# Patient Record
Sex: Female | Born: 1970 | Race: Black or African American | Hispanic: No | Smoking: Never smoker
Health system: Southern US, Community
[De-identification: ages and names within clinical notes are randomized; demographics above are authoritative.]

## PROBLEM LIST (undated history)

## (undated) DIAGNOSIS — G43909 Migraine, unspecified, not intractable, without status migrainosus: Secondary | ICD-10-CM

## (undated) DIAGNOSIS — E785 Hyperlipidemia, unspecified: Secondary | ICD-10-CM

## (undated) DIAGNOSIS — R87619 Unspecified abnormal cytological findings in specimens from cervix uteri: Secondary | ICD-10-CM

## (undated) DIAGNOSIS — E039 Hypothyroidism, unspecified: Secondary | ICD-10-CM

## (undated) DIAGNOSIS — E559 Vitamin D deficiency, unspecified: Secondary | ICD-10-CM

## (undated) DIAGNOSIS — L509 Urticaria, unspecified: Secondary | ICD-10-CM

## (undated) DIAGNOSIS — D649 Anemia, unspecified: Secondary | ICD-10-CM

## (undated) DIAGNOSIS — J45909 Unspecified asthma, uncomplicated: Secondary | ICD-10-CM

## (undated) HISTORY — DX: Vitamin D deficiency, unspecified: E55.9

## (undated) HISTORY — DX: Hyperlipidemia, unspecified: E78.5

## (undated) HISTORY — PX: TUBAL LIGATION: SHX77

## (undated) HISTORY — DX: Unspecified abnormal cytological findings in specimens from cervix uteri: R87.619

## (undated) HISTORY — DX: Anemia, unspecified: D64.9

## (undated) HISTORY — DX: Migraine, unspecified, not intractable, without status migrainosus: G43.909

## (undated) HISTORY — DX: Urticaria, unspecified: L50.9

## (undated) HISTORY — DX: Hypothyroidism, unspecified: E03.9

## (undated) HISTORY — DX: Unspecified asthma, uncomplicated: J45.909

---

## 2016-03-28 DIAGNOSIS — E039 Hypothyroidism, unspecified: Secondary | ICD-10-CM | POA: Insufficient documentation

## 2017-02-16 DIAGNOSIS — G43911 Migraine, unspecified, intractable, with status migrainosus: Secondary | ICD-10-CM | POA: Insufficient documentation

## 2017-05-20 DIAGNOSIS — E6609 Other obesity due to excess calories: Secondary | ICD-10-CM | POA: Insufficient documentation

## 2017-05-20 DIAGNOSIS — D649 Anemia, unspecified: Secondary | ICD-10-CM | POA: Insufficient documentation

## 2018-11-05 DIAGNOSIS — J45909 Unspecified asthma, uncomplicated: Secondary | ICD-10-CM | POA: Insufficient documentation

## 2018-12-25 DIAGNOSIS — R12 Heartburn: Secondary | ICD-10-CM | POA: Insufficient documentation

## 2019-05-23 ENCOUNTER — Ambulatory Visit (HOSPITAL_BASED_OUTPATIENT_CLINIC_OR_DEPARTMENT_OTHER)
Admission: RE | Admit: 2019-05-23 | Discharge: 2019-05-23 | Disposition: A | Discharge status: Home / Self Care | Payer: Managed Care, Other (non HMO) | Source: Ambulatory Visit | Attending: Family Medicine | Admitting: Family Medicine

## 2019-05-23 ENCOUNTER — Other Ambulatory Visit: Payer: Self-pay

## 2019-05-23 ENCOUNTER — Ambulatory Visit: Payer: Managed Care, Other (non HMO) | Admitting: Family Medicine

## 2019-05-23 ENCOUNTER — Encounter: Payer: Self-pay | Admitting: Family Medicine

## 2019-05-23 VITALS — BP 112/80 | HR 84 | Ht 64.5 in | Wt 227.2 lb

## 2019-05-23 DIAGNOSIS — M4310 Spondylolisthesis, site unspecified: Secondary | ICD-10-CM | POA: Diagnosis not present

## 2019-05-23 DIAGNOSIS — M4306 Spondylolysis, lumbar region: Secondary | ICD-10-CM

## 2019-05-23 DIAGNOSIS — M5416 Radiculopathy, lumbar region: Secondary | ICD-10-CM | POA: Insufficient documentation

## 2019-05-23 DIAGNOSIS — M4316 Spondylolisthesis, lumbar region: Secondary | ICD-10-CM | POA: Diagnosis not present

## 2019-05-23 IMAGING — DX DG LUMBAR SPINE COMPLETE 4+V
5 series · 5 of 5 positions shown · non-contrast
Comparison: None.

CLINICAL DATA: Left lower back pain with sciatica 1 month. No
injury.

EXAM:
LUMBAR SPINE - COMPLETE 4+ VIEW

[l-spine ap]
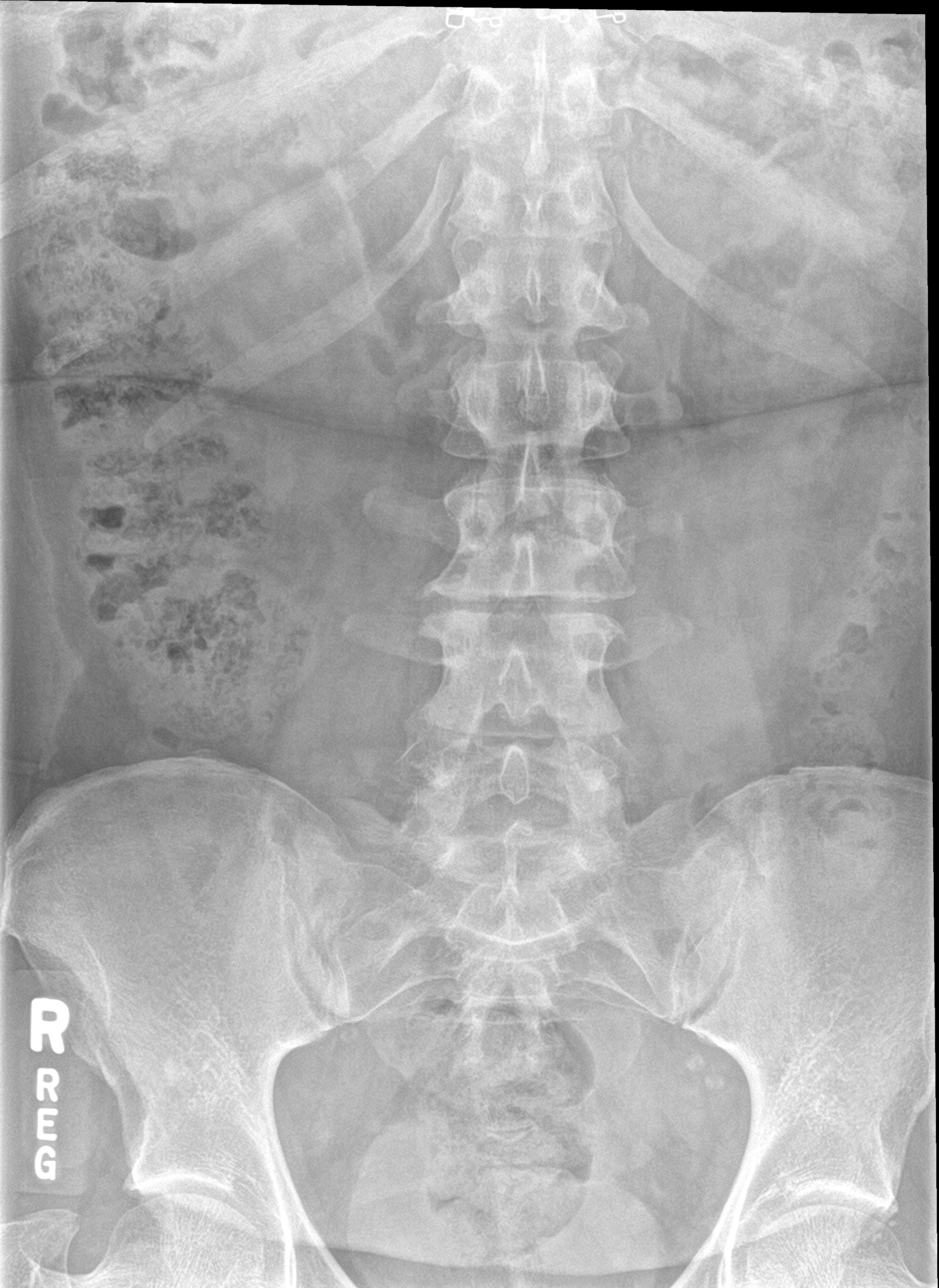

[l-spine obl (1 of 2)]
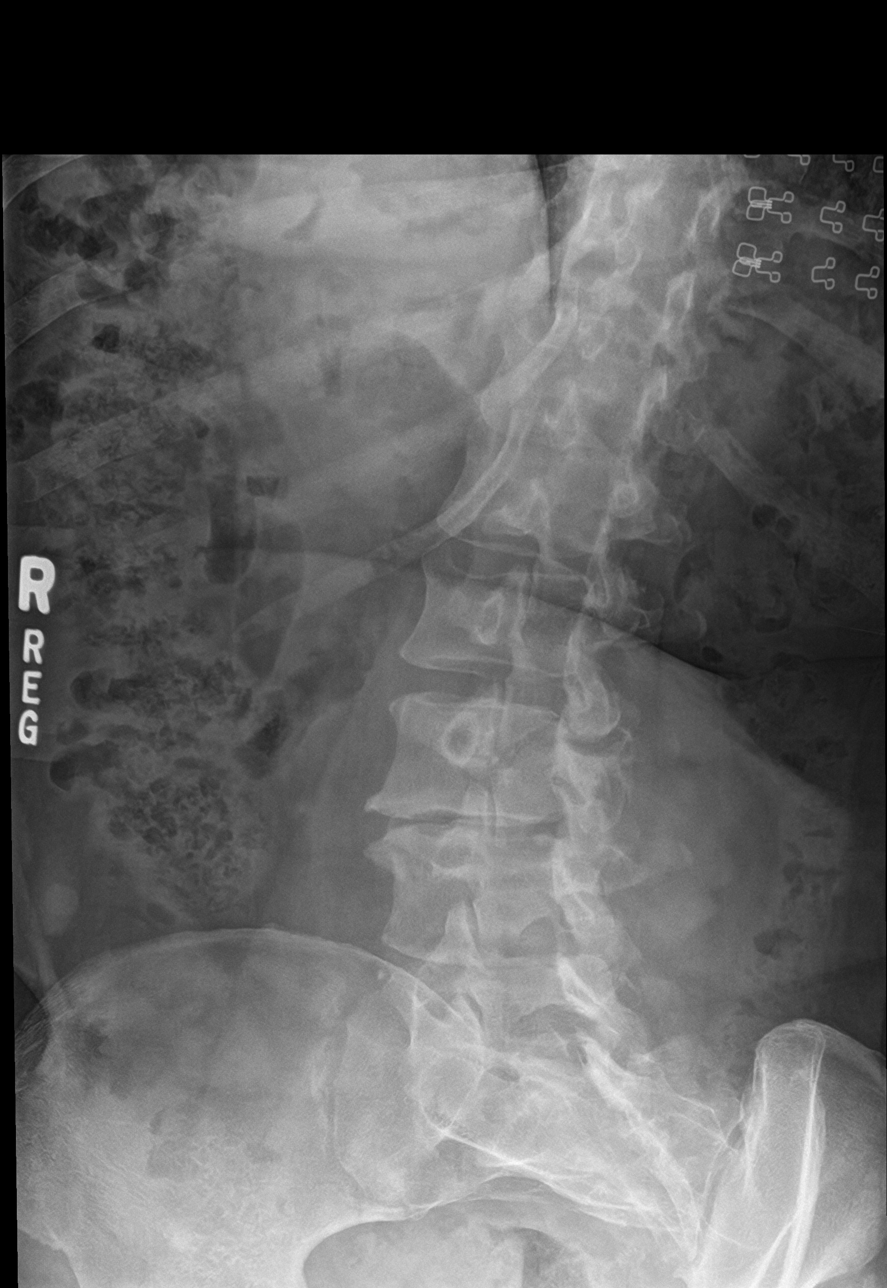

[l-spine obl (2 of 2)]
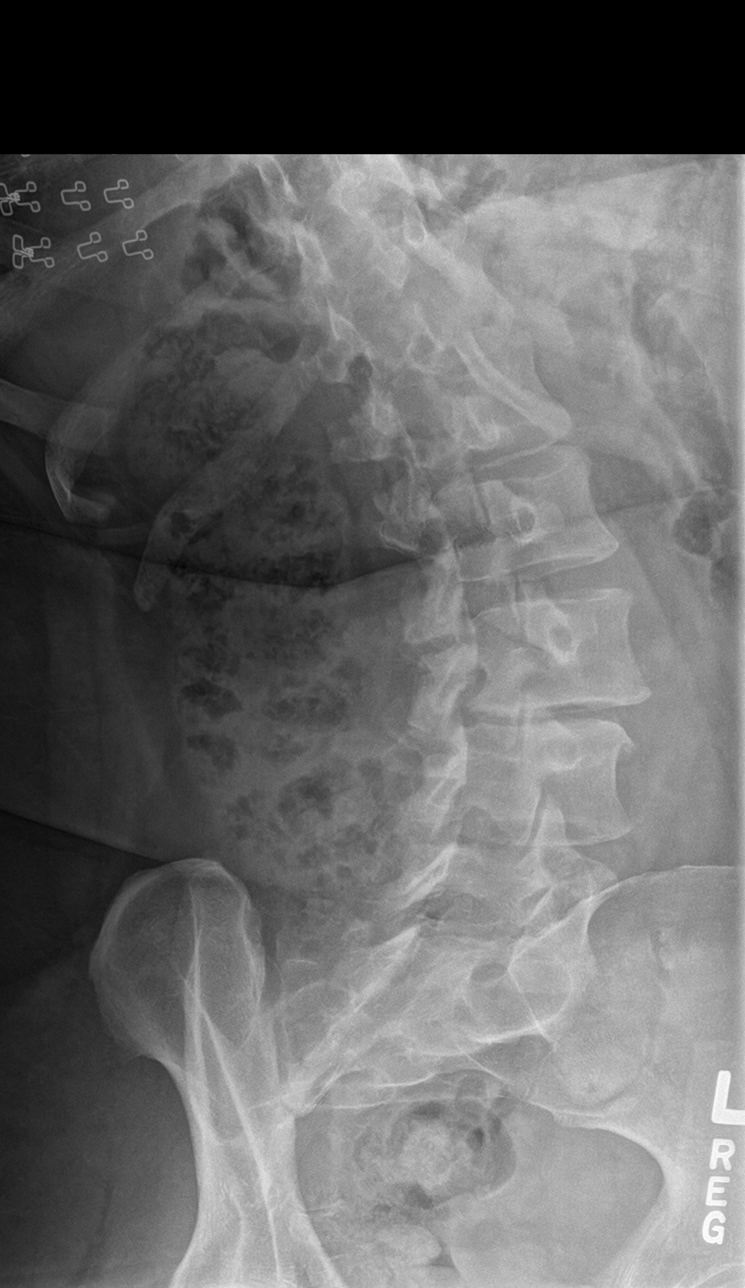

[l-spine lat]
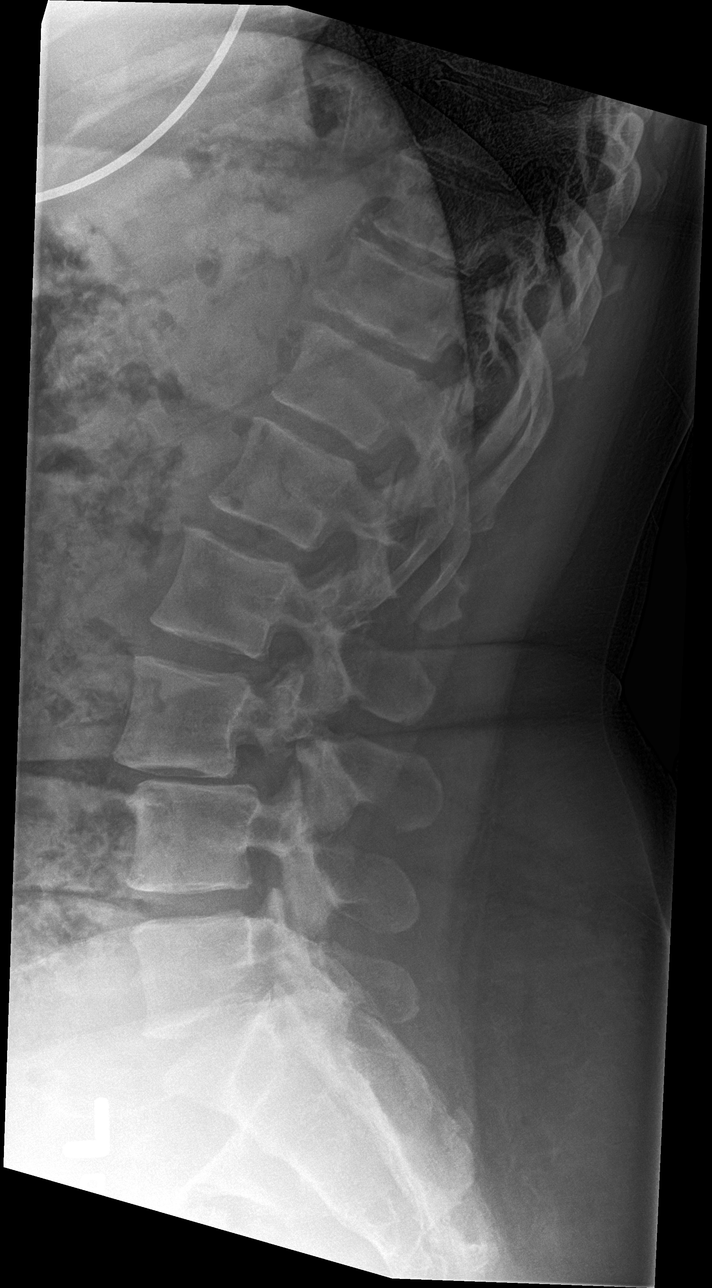

[l-spine spot]
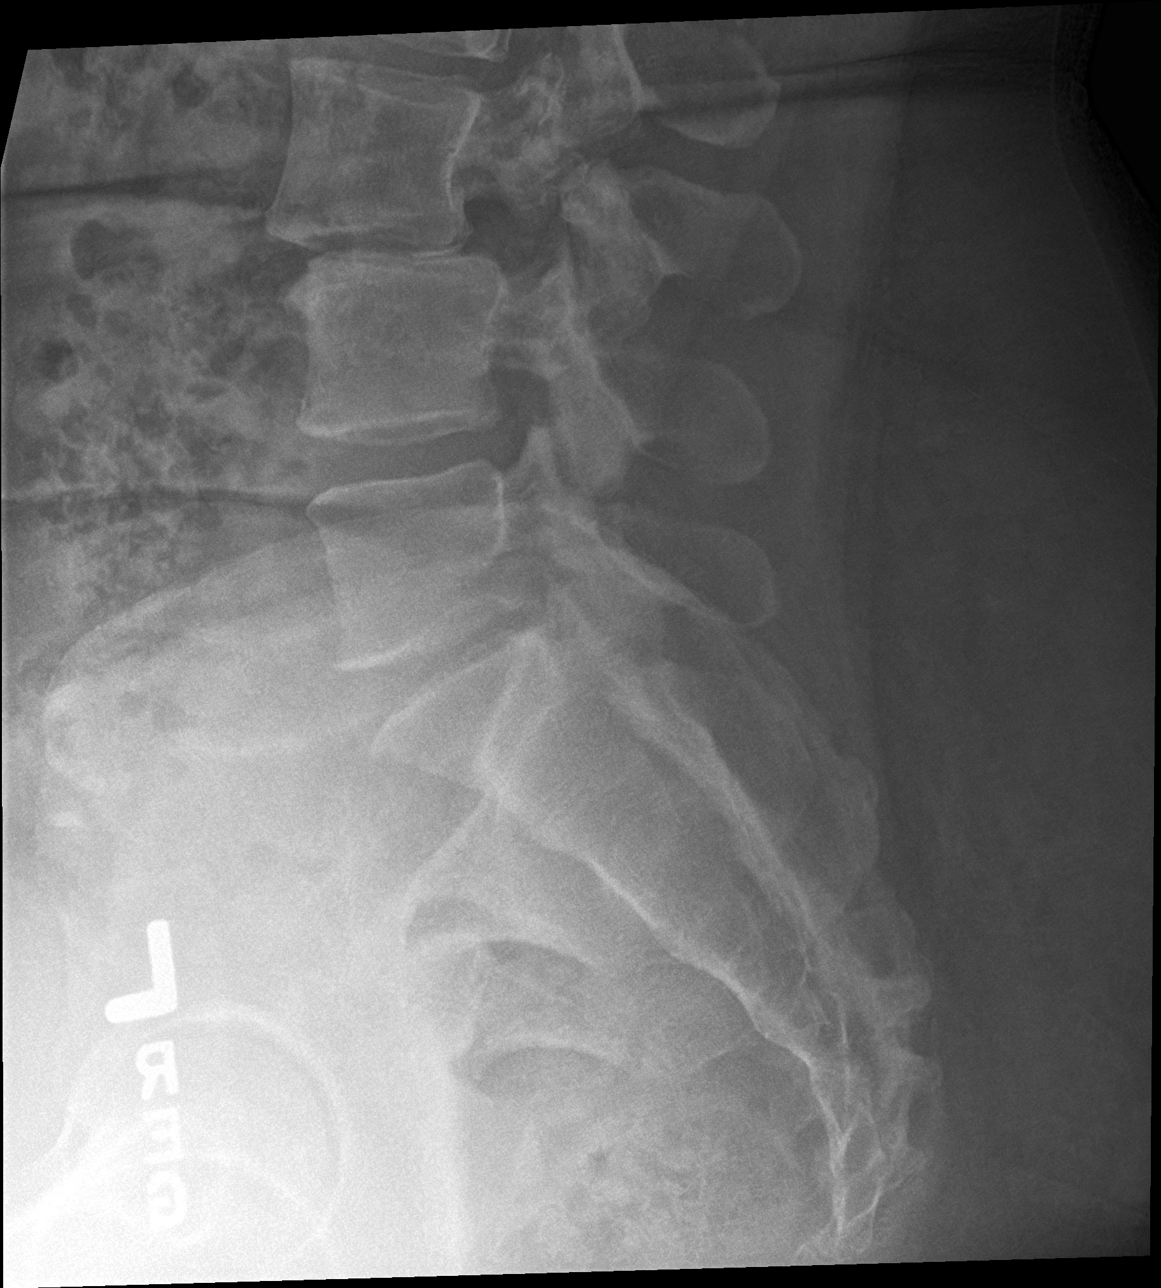

[5 of 5 positions shown; findings below may reference images not displayed]

FINDINGS: There is grade 1 anterolisthesis of L3 on L4 with bilateral L3
spondylolysis. Resultant moderate disc space narrowing at the L3-4
level. Minimal spondylosis of the lumbar spine. Facet arthropathy is
present over the lower lumbar spine.
IMPRESSION: Mild spondylosis of the lumbar spine with disc disease at the L3-4
level. There is grade 1 anterolisthesis of L3 on L4 due to bilateral
L3 spondylolysis.

## 2019-05-23 NOTE — Progress Notes (Addendum)
Subjective:    CC: Low back pain  I, Molly Weber, LAT, ATC, am serving as scribe for Dr. [name].  HPI: Pt is a 48 y/o female presenting w/ c/o low back pain x years.  Pt states that she did pretty well after the "shots" she received which she feels was in 2015.  Pt states her low back pain flared up about a month ago when she was running on the treadmill.  Her pain is located on the L side of her lower back w/ radiating pain into her L post thigh, stopping at the knee.  Pt denies any N/T into the L leg.  Aggravating factors include lifting, running, prolonged sitting, full L knee extension in tall sitting.  Pt has tried Flexeril, Naproxen, Advil, Aleve, lidocaine patches, heat and stretches.  The lidocaine patches did provide some relief.  Pain has been ongoing now for 4 weeks.  She saw her doctor in [place] about 4 weeks ago and started a series of physician directed conservative management exercises including stretching rest Flexeril and gabapentin.  She notes the lidocaine patches have helped more than anything else.  Past medical history, Surgical history, Family history not pertinant except as noted below, Social history, Allergies, and medications have been entered into the medical record, reviewed, and no changes needed.   Review of Systems: No headache, visual changes, nausea, vomiting, diarrhea, constipation, dizziness, abdominal pain, skin rash, fevers, chills, night sweats, weight loss, swollen lymph nodes, body aches, joint swelling, muscle aches, chest pain, shortness of breath, mood changes, visual or auditory hallucinations.   Objective:    Vitals:   05/23/19 0816  BP: 112/80  Pulse: 84  SpO2: 96%   General: Well Developed, well nourished, and in no acute distress.  Neuro/Psych: Alert and oriented x3, extra-ocular muscles intact, able to move all 4 extremities, sensation grossly intact. Skin: Warm and dry, no rashes noted.  Respiratory: Not using accessory  muscles, speaking in full sentences, trachea midline.  Cardiovascular: Pulses palpable, no extremity edema. Abdomen: Does not appear distended. MSK:  L-spine: Nontender to midline.  Tender to palpation left lumbar paraspinal musculature. Range of motion: Normal rotation and lateral flexion bilaterally.  Decreased extension and flexion range of motion. Lower extremity strength reflexes and sensation are equal and normal throughout bilateral extremities. Positive left-sided slump test. Normal gait.  Lab and Radiology Results X-ray images L-spine ordered and will be done later today or tomorrow. Addendum: Dg Lumbar Spine Complete  Result Date: 05/23/2019 CLINICAL DATA:  Left lower back pain with sciatica 1 month. No injury. EXAM: LUMBAR SPINE - COMPLETE 4+ VIEW COMPARISON:  None. FINDINGS: There is grade 1 anterolisthesis of L3 on L4 with bilateral L3 spondylolysis. Resultant moderate disc space narrowing at the L3-4 level. Minimal spondylosis of the lumbar spine. Facet arthropathy is present over the lower lumbar spine. 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 There is grade 1 anterolisthesis of L3 on L4 due to bilateral L3 spondylolysis. Electronically Signed   By: Elberta Fortis M.D.   On: 05/23/2019 15:02   I personally (independently) visualized and performed the interpretation of the images attached in this note.    Impression and Recommendations:    Assessment and Plan: 48 y.o. female with  Low back pain with lumbar or sacral radiculopathy likely S1 dermatome to the left side.  Plan for x-ray as above and referral to physical therapy.  If not improving in a few  weeks neck step would be MRI for injection planning.  Patient will also get detailed medical records of her last series of injections so we can know what worked and what did not work for her previously.  She will see me a MyChart message in about 2 weeks for an update.  Addendum: X-ray  shows unexpected anterolisthesis and spondylolysis at L3-4.  This could potentially cause some of her radicular symptoms.  Plan to proceed with MRI now.  This is for epidural injection and surgical planning.  PDMP reviewed during this encounter. Orders Placed This Encounter  Procedures  . DG Lumbar Spine Complete    Standing Status:   Future    Standing Expiration Date:   07/22/2020    Order Specific Question:   Reason for Exam (SYMPTOM  OR DIAGNOSIS REQUIRED)    Answer:   eval LBP    Order Specific Question:   Is patient pregnant?    Answer:   No    Order Specific Question:   Preferred imaging location?    Answer:   [facility] Specific Question:   Radiology Contrast Protocol - do NOT remove file path    Answer:   [email or web address]  . Ambulatory referral to Physical Therapy    Referral Priority:   Routine    Referral Type:   Physical Medicine    Referral Reason:   Specialty Services Required    Requested Specialty:   Physical Therapy   No orders of the defined types were placed in this encounter.   Discussed warning signs or symptoms. Please see discharge instructions. Patient expresses understanding.   The above documentation has been reviewed and is accurate and complete Lynne Leader

## 2019-05-23 NOTE — Patient Instructions (Addendum)
Thank you for coming in today.  We will proceed with xray and likely MRI.  OK to continue lidocaine patches, and gabapentin.   Please try to get detailed records from your last injection that worked. We will plan on doing the exact same thing.   You can attach records or documents to [place] message or you can have them faxed to me.   Come back or go to the emergency room if you notice new weakness new numbness problems walking or bowel or bladder problems.

## 2019-05-26 ENCOUNTER — Encounter: Payer: Self-pay | Admitting: Physical Therapy

## 2019-05-26 DIAGNOSIS — M4306 Spondylolysis, lumbar region: Secondary | ICD-10-CM | POA: Insufficient documentation

## 2019-05-26 NOTE — Progress Notes (Signed)
X-ray lumbar spine shows shifting of one vertebrae level in relationship to the other vertebrae level.  This occurs at L3-4.  This could cause some pinching of the nerve as it goes down your leg.  This finding should be enough to proceed with MRI.  I will go ahead and order MRI now.  This condition can often significantly be improved with core stabilizing physical therapy exercises.

## 2019-05-26 NOTE — Addendum Note (Signed)
Addended by: Gregor Hams on: 05/26/2019 09:28 AM   Modules accepted: Orders

## 2019-05-28 ENCOUNTER — Encounter: Payer: Self-pay | Admitting: Physical Therapy

## 2019-05-28 ENCOUNTER — Other Ambulatory Visit: Payer: Self-pay

## 2019-05-28 ENCOUNTER — Ambulatory Visit: Payer: Managed Care, Other (non HMO) | Attending: Family Medicine | Admitting: Physical Therapy

## 2019-05-28 DIAGNOSIS — R293 Abnormal posture: Secondary | ICD-10-CM | POA: Insufficient documentation

## 2019-05-28 DIAGNOSIS — M6281 Muscle weakness (generalized): Secondary | ICD-10-CM | POA: Diagnosis present

## 2019-05-28 DIAGNOSIS — M5442 Lumbago with sciatica, left side: Secondary | ICD-10-CM | POA: Insufficient documentation

## 2019-05-28 DIAGNOSIS — G8929 Other chronic pain: Secondary | ICD-10-CM | POA: Diagnosis present

## 2019-05-28 NOTE — Therapy (Signed)
[facility] [address] [place], [place], [address] Phone: [phone]   Fax:  [phone]  Physical Therapy Evaluation  Patient Details  Name: Deanna Sellers MRN: [id number] Date of Birth: 11/26/1970 Referring Provider (PT): Rodolph Bong, MD   Encounter Date: 05/28/2019  PT End of Session - 05/28/19 1308    Visit Number  1    Date for PT Re-Evaluation  07/23/19    Authorization Type  [employer]    PT Start Time  1015    PT Stop Time  1100    PT Time Calculation (min)  45 min    Activity Tolerance  Patient tolerated treatment well;No increased pain    Behavior During Therapy  WFL for tasks assessed/performed       Past Medical History:  Diagnosis Date  . Abnormal Pap smear of cervix   . Anemia   . Asthma   . HLD (hyperlipidemia)   . Hypothyroidism   . Migraine   . Urticaria   . Vitamin D deficiency     Past Surgical History:  Procedure Laterality Date  . CESAREAN SECTION N/A 1993  . TUBAL LIGATION      There were no vitals filed for this visit.   Subjective Assessment - 05/28/19 1018    Subjective  Pt has chronic LBP x 14+ years.  Pain is left sided back, buttock and wraps to Lt knee.  Recent xray revealed Gr I spondylolisthesis L3 on L4 with bil L3 spondylolysis.    Limitations  Sitting;Standing;Walking;Other (comment)   laying down   How long can you sit comfortably?  unlimited today but sometimes pain limits    How long can you stand comfortably?  unlimited today but sometimes pain limits    How long can you walk comfortably?  5 min    Diagnostic tests  XRAYS: Gr I spondylolisthesis L3 on L4 with bil L3 spondylolysis, MRI ordered    Patient Stated Goals  MD asked me to come, ease pain, relearn HEP from past PT    Currently in Pain?  Yes    Pain Score  2     Pain Location  Back    Pain Orientation  Left;Lower    Pain Descriptors / Indicators  Aching    Pain Type  Chronic pain    Pain Radiating  Towards  Lt buttock and wraps to anterior thigh to knee    Pain Onset  More than a month ago    Pain Frequency  Intermittent    Aggravating Factors   walking, sitting sometimes, lifting    Pain Relieving Factors  Lidocane patches and heat patches, injections    Effect of Pain on Daily Activities  walking         [facility] PT Assessment - 05/28/19 0001      Assessment   Medical Diagnosis  M54.16 (ICD-10-CM) - Lumbar radiculitis    Referring Provider (PT)  Rodolph Bong, MD    Onset Date/Surgical Date  --   14+ years ago   Hand Dominance  Right    Next MD Visit  waiting to do MRI, then f/u    Prior Therapy  yes, didn't help      Precautions   Precaution Comments  sponsylolysis L3 and spondylolisthesis present L3 on L4    Required Braces or Orthoses  --   Pt wears lumbar brace for walking for exercise     Restrictions   Weight Bearing Restrictions  No      Balance Screen   Has the patient fallen in the past 6 months  No      [place] residence    Living Arrangements  Alone    Type of [place] to enter    Entrance Stairs-Number of Steps  [place]  One level      Prior Function   Level of Independence  Independent    Vocation  Full time employment    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    Leisure  walk for exercise, elliptical       Cognition   Overall Cognitive Status  Within Functional Limits for tasks assessed      Observation/Other Assessments   Observations  avoids sitting fully through Lt buttock during history taking    Focus on Therapeutic Outcomes (FOTO)   next visit, site running slow      Sensation   Light Touch  Appears Intact      Posture/Postural Control   Posture/Postural Control  Postural limitations    Postural Limitations  Increased lumbar lordosis      ROM / Strength   AROM / PROM / Strength  AROM;Strength      AROM   Overall AROM Comments   trunk FB limited 60% due to pain, extension not tested due to lumbar history, bil SB full and painfree,       Strength   Overall Strength Comments  LEs 5/5 bil with exception of: 4/5 hip ext bil, bil hip flexors 4/5, poor core recruitment and stability as tested in supine, sidelying, quadruped.  Deep multifidus auto-recruitment in prone with slight knee bend bil      Flexibility   Soft Tissue Assessment /Muscle Length  yes   hip flexors limited 30% bil   Hamstrings  limited on Lt 50% with neural tension present    Quadriceps  limited 20% bil      Palpation   Spinal mobility  excessive anterior shear L3 on L4, limited thoracic mobility throughout    Palpation comment  tender: Lt lumbar multifidi, PSIS, glute max/med/min, piriformis, ITB, lateral quad, adductors      Special Tests    Special Tests  Lumbar    Lumbar Tests  Straight Leg Raise      Straight Leg Raise   Findings  Positive    Side   Left    Comment  at 45 deg      High Level Balance   High Level Balance Comments  Pt with difficulty standing SLS on Lt > 5 sec, wobbly compared to Rt                Objective measurements completed on examination: See above findings.              PT Education - 05/28/19 1101    Education Details  Access Code: [email or web address]    Person(s) Educated  Patient    Methods  Explanation;Demonstration;Tactile cues;Verbal cues    Comprehension  Verbalized understanding;Returned demonstration       PT Short Term Goals - 05/28/19 1316      PT SHORT TERM GOAL #1   Title  Pt will be ind in initial HEP    Time  4    Period  Weeks    Status  New    Target Date  06/25/19      PT SHORT TERM GOAL #2   Title  Pt will demo proper recruitment of TrA and proper body mechanics for functional squat.    Time  4    Period  Weeks    Status  New    Target Date  06/25/19        PT Long Term Goals - 05/28/19 1318      PT LONG TERM GOAL #1   Title  Pt will be ind in advanced HEP and  understand how to safely progress.    Time  8    Period  Weeks    Status  New    Target Date  07/23/19      PT LONG TERM GOAL #2   Title  Pt will be able to walk for exercise at least 3 days per week for 20 min with pain not to exceed 3/10.    Time  8    Period  Weeks    Status  New    Target Date  07/23/19      PT LONG TERM GOAL #3   Title  Pt will achieve 5/5 strength for core and LEs to better support her spine for daily tasks and functional movements.    Time  8    Period  Weeks    Status  New    Target Date  07/23/19      PT LONG TERM GOAL #4   Title  Pt will report reduced pain by at least 50% with all daily tasks.    Time  8    Period  Weeks    Status  New    Target Date  07/23/19             Plan - 05/28/19 1309    Clinical Impression Statement  Pt with long history of LBP and Lt LE pain consistent with radicular symptoms from L3/L4.  She has signif findings on recent xrays for spondylolysis at L3 and spondylolisthesis of L3 on L4 (Gr 1).  An MRI has been ordered.  She has had PT before without improvement.  She has limited trunk ROM, limited strength of core and select LE muscles, limited tolerance of standing and walking due to pain, + SLR on Lt.  PT introduced core contractions today and initiated pelvic tilts.  She uses lumbar brace and compression garments which help.  PT encouraged her to start recruiting core muscles within these supports.  Pt will continue to benefit from skilled PT to address these deficits and imrpove pain and function.    Personal Factors and Comorbidities  Past/Current Experience;Time since onset of injury/illness/exacerbation;Other    Comorbidities  symptoms x 14+ years, signif xray findings in lumbar spine, prior PT hasn't helped    Examination-Activity Limitations  Locomotion Level;Bend;Sit;Squat;Stand;Lift    Examination-Participation Restrictions  Cleaning;Meal Prep;Shop;Laundry    Stability/Clinical Decision Making  Evolving/Moderate  complexity    Clinical Decision Making  Moderate    Rehab Potential  Good    PT Frequency  2x / week    PT Duration  8 weeks    PT Treatment/Interventions  ADLs/Self Care Home Management;Cryotherapy;Electrical Stimulation;Aquatic Therapy;Iontophoresis /ml Dexamethasone;Moist Heat;Functional mobility training;Therapeutic activities;Therapeutic exercise;Balance training;Neuromuscular re-education;Patient/family education;Manual techniques;Passive range of motion;Dry needling;Taping;Joint Manipulations;Spinal Manipulations    PT Next Visit Plan  needs FOTO, review core and pelvic tilts, gentle core progression, thoracic mobs, body mechanics functional squat with core,    PT Home Exercise Plan  Access Code:  [email or web address]    Recommended Other Services  is initial cert signed?    Consulted and Agree with Plan of Care  Patient       Patient will benefit from skilled therapeutic intervention in order to improve the following deficits and impairments:  Decreased range of motion, Increased fascial restricitons, Decreased activity tolerance, Hypermobility, Pain, Decreased balance, Hypomobility, Impaired flexibility, Improper body mechanics, Decreased strength, Postural dysfunction, Decreased mobility  Visit Diagnosis: Chronic left-sided low back pain with left-sided sciatica - Plan: PT plan of care cert/re-cert  Muscle weakness (generalized) - Plan: PT plan of care cert/re-cert  Abnormal posture - Plan: PT plan of care cert/re-cert     Problem List Patient Active Problem List   Diagnosis Date Noted  . Spondylolysis of lumbar region 05/26/2019  . Heartburn 12/25/2018  . Moderate asthma 11/05/2018  . Anemia 05/20/2017  . Obesity due to excess calories without serious comorbidity 05/20/2017  . Intractable migraine with status migrainosus 02/16/2017  . Hypothyroidism, acquired 03/28/2016    Morton PetersJohanna Xyla Leisner, PT 05/28/19 1:25 PM   [facility] [address] [place], [place], [address] Phone: [phone]   Fax:  [phone]  Name: Deanna Sellers MRN: [id number] Date of Birth: May 22, 1971

## 2019-05-28 NOTE — Patient Instructions (Signed)
Access Code: [email or web address]  URL: [email or web address]  Date: 05/28/2019  Prepared by: Venetia Night Stephene Alegria   Exercises  Supine Posterior Pelvic Tilt - 10 reps - 3 sets - 5 hold - 1x daily - 7x weekly  Supine Pelvic Floor Contract and Release - 10 reps - 3 sets - 10 hold - 1x daily - 7x weekly  Sidelying Transversus Abdominis Bracing - 10 reps - 3 sets - 10 hold - 1x daily - 7x weekly  Patient Education  Trigger Point Dry Needling

## 2019-06-01 ENCOUNTER — Ambulatory Visit (INDEPENDENT_AMBULATORY_CARE_PROVIDER_SITE_OTHER): Payer: Managed Care, Other (non HMO)

## 2019-06-01 ENCOUNTER — Other Ambulatory Visit: Payer: Self-pay

## 2019-06-01 DIAGNOSIS — M4316 Spondylolisthesis, lumbar region: Secondary | ICD-10-CM | POA: Diagnosis not present

## 2019-06-01 IMAGING — MR MR LUMBAR SPINE W/O CM
4 of 5 series · 25 of 48 positions shown · non-contrast
Comparison: Lumbar radiographs [DATE].

CLINICAL DATA: 48-year-old female with low back pain, left leg pain
and weakness. Chronic but progressive symptoms for the past
months. No known injury.

EXAM:
MRI LUMBAR SPINE WITHOUT CONTRAST
TECHNIQUE: Multiplanar, multisequence MR imaging of the lumbar spine was
performed. No intravenous contrast was administered.

[Series 4: T2 · sagittal · 4.0mm · 0.81mm/px · 6 of 15 slices shown (1 of 2)]
[im 1/15]
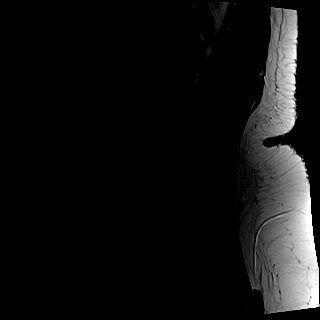
[im 3/15]
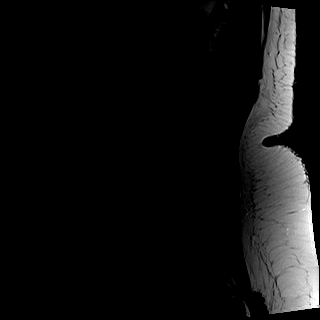
[im 6/15]
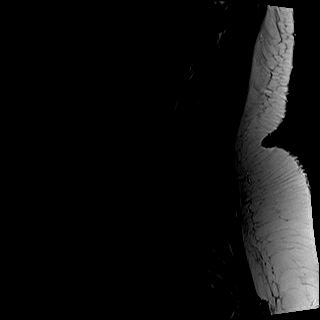
[im 9/15]
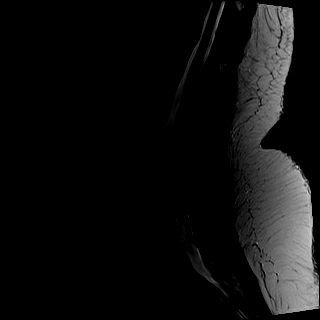
[im 12/15]
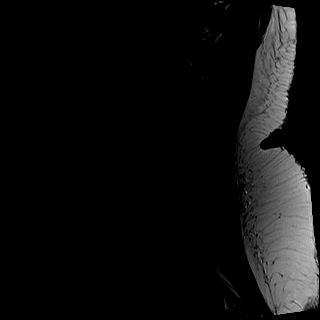
[im 15/15]
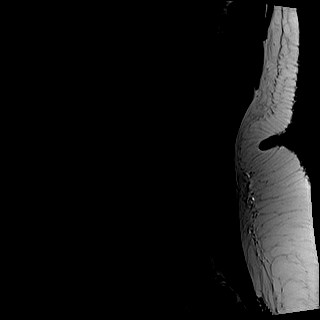

[Series 5: T1 · sagittal · 4.0mm · 0.41mm/px · 6 of 15 slices shown (1 of 2)]
[im 1/15]
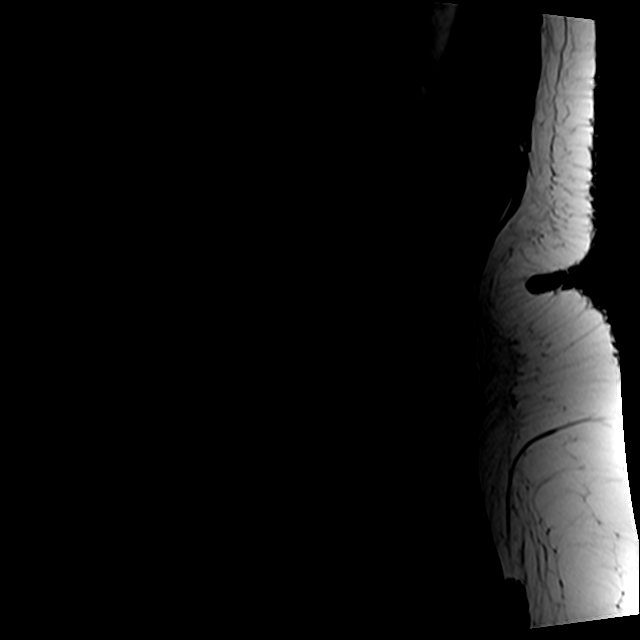
[im 3/15]
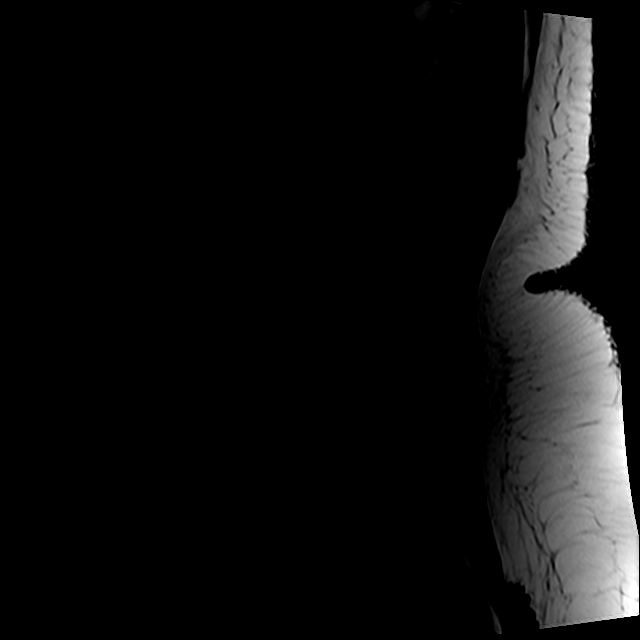
[im 6/15]
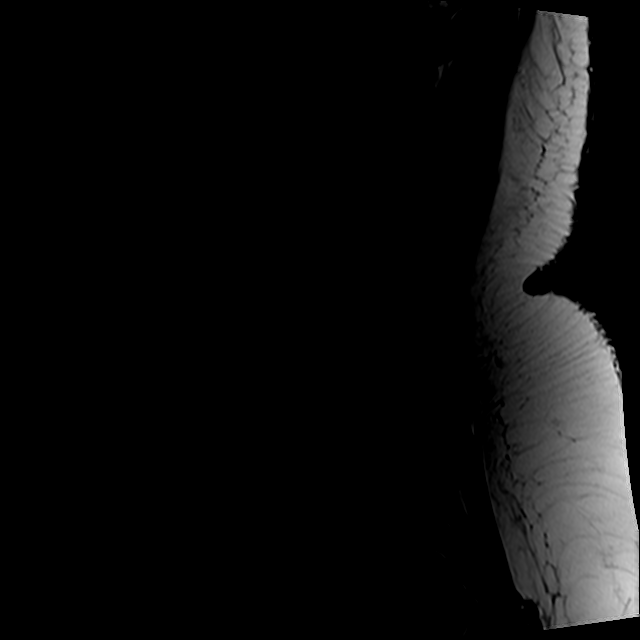
[im 9/15]
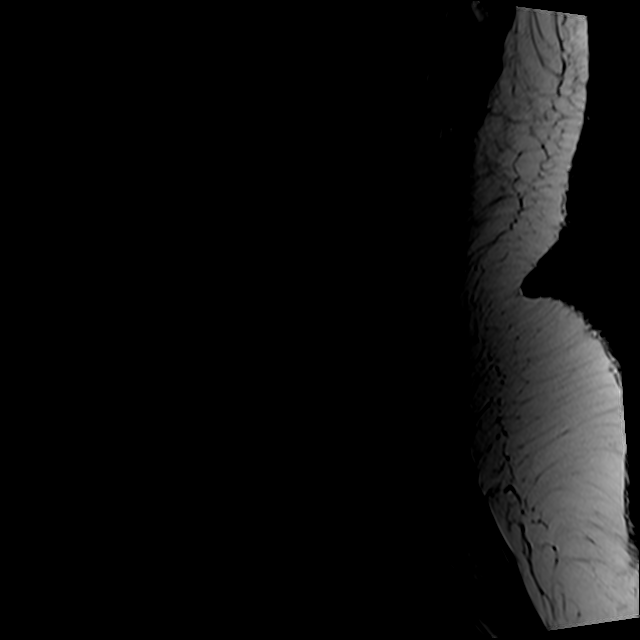
[im 12/15]
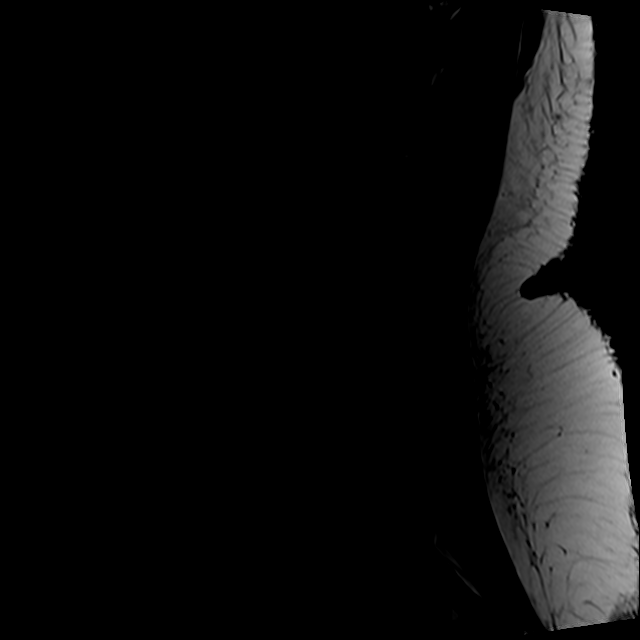
[im 15/15]
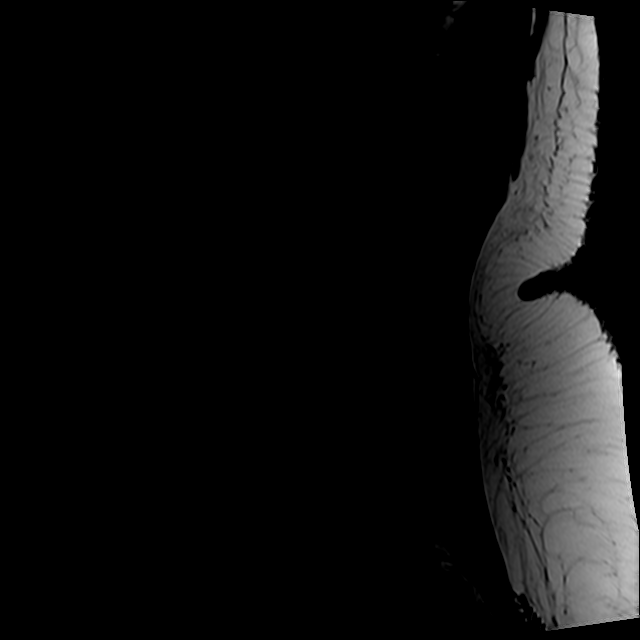

[Series 7: T2 · axial · 4.0mm · 0.78mm/px · z∈[-75,+142]mm · 9 of 40 slices shown (2 of 2)]
[im 1/40]
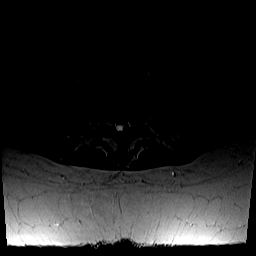
[im 6/40]
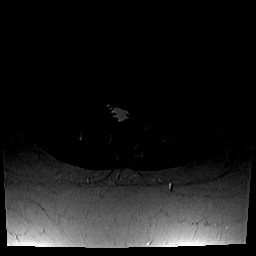
[im 12/40]
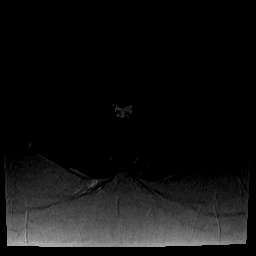
[im 17/40]
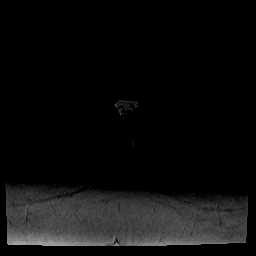
[im 20/40]
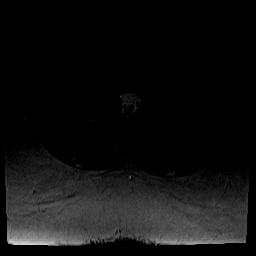
[im 23/40]
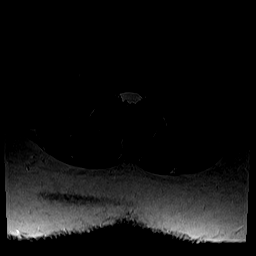
[im 28/40]
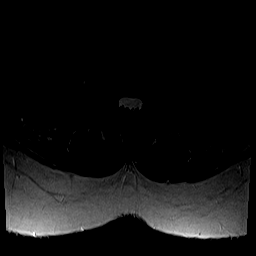
[im 34/40]
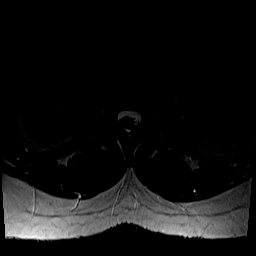
[im 40/40]
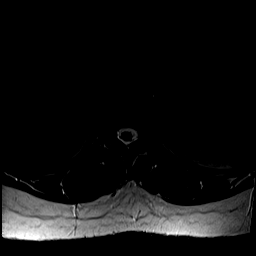

[Series 8: T1 · axial · 4.0mm · 0.39mm/px · z∈[-75,+112]mm · 4 of 40 slices shown (2 of 2)]
[im 1/40]
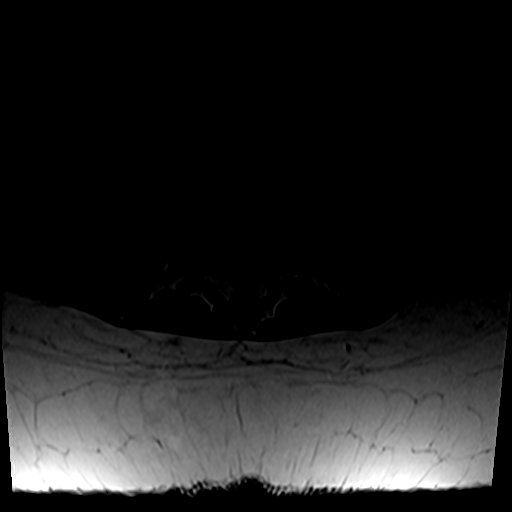
[im 6/40]
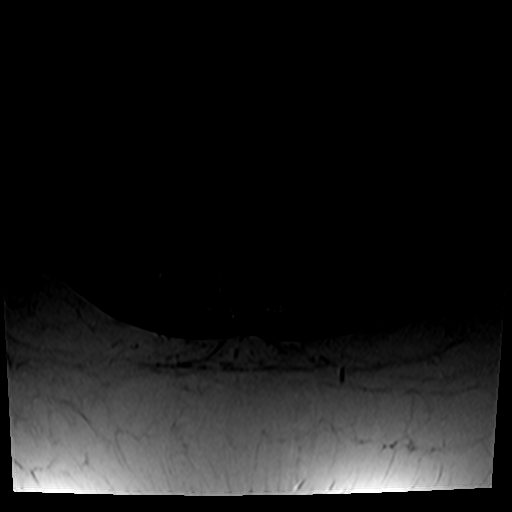
[im 20/40]
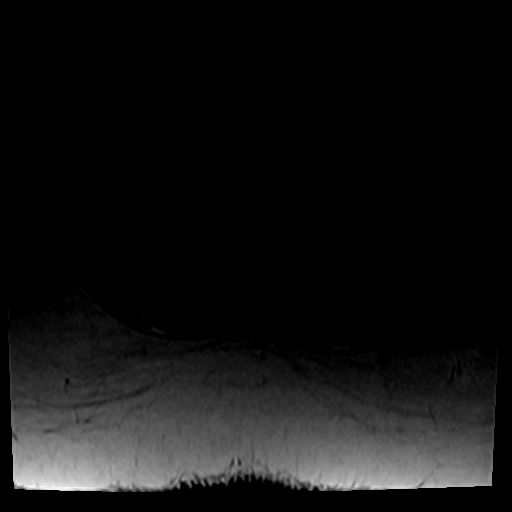
[im 34/40]
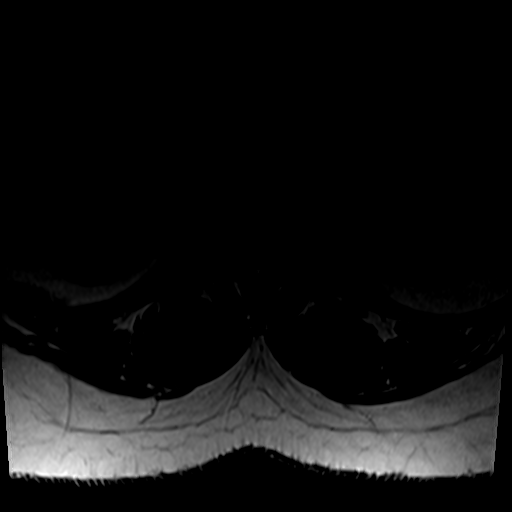

[25 of 48 positions shown; findings below may reference images not displayed]

FINDINGS: Segmentation:  Normal on the comparison radiographs.

Alignment: Grade 1 anterolisthesis of L3 on L4 appears stable
measuring 7 millimeters. Subtle retrolisthesis at L4-L5 and L5-S1.
Mild straightening of lower lumbar lordosis.

Vertebrae: Chronic bilateral L3 pars fractures suspected. No marrow
edema or evidence of acute osseous abnormality. Visualized bone
marrow signal is within normal limits. Intact visible sacrum and SI
joints.

Conus medullaris and cauda equina: Conus extends to the L1 level. No
lower spinal cord or conus signal abnormality.

Paraspinal and other soft tissues: Negative; probable left ovary
which is partially visible anterior to the psoas muscle on series 7,
image 40 and sagittal series 6, image 15.

Disc levels:

T11-T12: Chronic disc desiccation and anterior disc bulge with
endplate spurring. No stenosis.

T12-L1:  Negative.

L1-L2:  Negative.

L2-L3:  Negative disc. Mild facet hypertrophy. No stenosis.

L3-L4: Disc desiccation and disc space loss with circumferential
disc/pseudo disc. Broad-based posterior component. Mild to moderate
facet hypertrophy. No spinal or lateral recess stenosis but moderate
bilateral L3 foraminal stenosis.

L4-L5: Disc desiccation and mild disc space loss. Mild
circumferential disc bulge with superimposed small right paracentral
disc protrusion (series 7, image 29), annular fissure. Mild
posterior element hypertrophy. Borderline to mild right lateral
recess stenosis without spinal stenosis (right L5 nerve level). No
foraminal stenosis.

L5-S1: Circumferential disc bulge with mild endplate spurring.
Superimposed small to moderate size disc extrusion into the left
lateral recess (series 4, image 8 and series 7, image 35). Moderate
left lateral recess stenosis (left S1 nerve level). No significant
spinal stenosis. Mild left greater than right L5 foraminal stenosis.
IMPRESSION: 1. Symptomatic level with regard to the left leg appears to be L5-S1
where there is a small-to-moderate disc extrusion into the left
lateral recess. Query Left S1 radiculitis.
2. L3-L4 spondylolisthesis with chronic pars fractures, disc and
posterior element degeneration. Subsequent moderate bilateral L3
neural foraminal stenosis.
3. Small right paracentral disc herniation at L4-L5 mildly affecting
the right lateral recess.

## 2019-06-02 NOTE — Progress Notes (Signed)
MRI lumbar spine shows several findings.  Bulging disc at L5-S1 impacting the left S1 nerve root likely.  Additionally spondylolisthesis at L3-L4 which could compress the L3 nerve root.  These both could be addressed with epidural steroid injections. I am happy to order the injections now or we can schedule a time to talk about the MRI results and get the medical records.  Let me know what you would like to do.

## 2019-06-04 ENCOUNTER — Encounter: Payer: Self-pay | Admitting: Physical Therapy

## 2019-06-04 ENCOUNTER — Other Ambulatory Visit: Payer: Self-pay

## 2019-06-04 ENCOUNTER — Ambulatory Visit: Payer: Managed Care, Other (non HMO) | Admitting: Physical Therapy

## 2019-06-04 DIAGNOSIS — M5442 Lumbago with sciatica, left side: Secondary | ICD-10-CM | POA: Diagnosis not present

## 2019-06-04 DIAGNOSIS — G8929 Other chronic pain: Secondary | ICD-10-CM

## 2019-06-04 DIAGNOSIS — M6281 Muscle weakness (generalized): Secondary | ICD-10-CM

## 2019-06-04 DIAGNOSIS — R293 Abnormal posture: Secondary | ICD-10-CM

## 2019-06-05 NOTE — Therapy (Addendum)
[facility] [address] [place], [place], [address] Phone: [phone]   Fax:  [phone]  Physical Therapy Treatment  Patient Details  Name: Deanna Sellers MRN: [id number] Date of Birth: 02-18-1971 Referring Provider (PT): Gregor Hams, MD   Encounter Date: 06/04/2019  PT End of Session - 06/05/19 0730    Visit Number  1    Date for PT Re-Evaluation  07/23/19    Authorization Type  [employer]    PT Start Time  1100    PT Stop Time  1155    PT Time Calculation (min)  55 min    Activity Tolerance  Patient tolerated treatment well;No increased pain    Behavior During Therapy  WFL for tasks assessed/performed       Past Medical History:  Diagnosis Date  . Abnormal Pap smear of cervix   . Anemia   . Asthma   . HLD (hyperlipidemia)   . Hypothyroidism   . Migraine   . Urticaria   . Vitamin D deficiency     Past Surgical History:  Procedure Laterality Date  . CESAREAN SECTION N/A 1993  . TUBAL LIGATION      There were no vitals filed for this visit.  Subjective Assessment - 06/04/19 1111    Subjective  Pt states she has some issues with her HEP.    Limitations  Sitting;Standing;Walking;Other (comment)   laying down   How long can you sit comfortably?  unlimited today but sometimes pain limits    How long can you stand comfortably?  unlimited today but sometimes pain limits    How long can you walk comfortably?  5 min    Diagnostic tests  XRAYS: Gr I spondylolisthesis L3 on L4 with bil L3 spondylolysis, MRI ordered    Patient Stated Goals  MD asked me to come, ease pain, relearn HEP from past PT    Currently in Pain?  Yes   pain Lt low back knee and ankle (no rating given)   Pain Onset  More than a month ago         [facility] PT Assessment - 06/04/19 0001      Observation/Other Assessments   Focus on Therapeutic Outcomes (FOTO)   41% limited                    [facility] Adult PT  Treatment/Exercise - 06/05/19 0001      Exercises   Exercises  Lumbar      Lumbar Exercises: Supine   Pelvic Tilt  1 rep    Pelvic Tilt Limitations  pt unable without high pain reports     Bent Knee Raise  5 reps    Bent Knee Raise Limitations  with abdominal brace, x10 reps on Rt, stopped at 5 on Lt due to pain     Other Supine Lumbar Exercises  transverse abdominus brace in hooklying 10x5 sec; sciatic nerve floss (gentle) with PT supporting thigh x10 reps     Other Supine Lumbar Exercises  abdominal bracing with gentle adductor ball squeeze x10 reps       Modalities   Modalities  Moist Heat      Moist Heat Therapy   Number Minutes Moist Heat  10 Minutes    Moist Heat Location  Lumbar Spine      Manual Therapy   Manual therapy comments  gentle STM Lt hamstring, Lt gluteals  PT Education - 06/05/19 0729    Education Details  removed posterior pelvic tilts from HEP; technique with therex    Person(s) Educated  Patient    Methods  Explanation    Comprehension  Verbalized understanding       PT Short Term Goals - 05/28/19 1316      PT SHORT TERM GOAL #1   Title  Pt will be ind in initial HEP    Time  4    Period  Weeks    Status  New    Target Date  06/25/19      PT SHORT TERM GOAL #2   Title  Pt will demo proper recruitment of TrA and proper body mechanics for functional squat.    Time  4    Period  Weeks    Status  New    Target Date  06/25/19        PT Long Term Goals - 05/28/19 1318      PT LONG TERM GOAL #1   Title  Pt will be ind in advanced HEP and understand how to safely progress.    Time  8    Period  Weeks    Status  New    Target Date  07/23/19      PT LONG TERM GOAL #2   Title  Pt will be able to walk for exercise at least 3 days per week for 20 min with pain not to exceed 3/10.    Time  8    Period  Weeks    Status  New    Target Date  07/23/19      PT LONG TERM GOAL #3   Title  Pt will achieve 5/5 strength for core and  LEs to better support her spine for daily tasks and functional movements.    Time  8    Period  Weeks    Status  New    Target Date  07/23/19      PT LONG TERM GOAL #4   Title  Pt will report reduced pain by at least 50% with all daily tasks.    Time  8    Period  Weeks    Status  New    Target Date  07/23/19            Plan - 06/05/19 0730    Clinical Impression Statement  Pt arrived with reported increase in pain since the evaluation. Symptoms have been in the knee and ankle without any relief. PT reviewed HEP, but pt was unable to complete posterior pelvic without reports of high-level pain. This was removed from her HEP with emphasis on core stabilization and abdominal bracing. Despite the modifications with therex, pt still struggled with reports of high pain and inability to find comfort. Ended session with gentle soft tissue mobilization and heat modality to decrease irritability of the pt's low back. Will continue to monitor pt response to exercise and intervention moving forward.    Personal Factors and Comorbidities  Past/Current Experience;Time since onset of injury/illness/exacerbation;Other    Comorbidities  symptoms x 14+ years, signif xray findings in lumbar spine, prior PT hasn't helped    Examination-Activity Limitations  Locomotion Level;Bend;Sit;Squat;Stand;Lift    Examination-Participation Restrictions  Cleaning;Meal Prep;Shop;Laundry    Stability/Clinical Decision Making  Evolving/Moderate complexity    Rehab Potential  Good    PT Frequency  2x / week    PT Duration  8 weeks    PT Treatment/Interventions  ADLs/Self Care Home Management;Cryotherapy;Electrical Stimulation;Aquatic Therapy;Iontophoresis 10m/ml Dexamethasone;Moist Heat;Functional mobility training;Therapeutic activities;Therapeutic exercise;Balance training;Neuromuscular re-education;Patient/family education;Manual techniques;Passive range of motion;Dry needling;Taping;Joint Manipulations;Spinal  Manipulations    PT Next Visit Plan  pt with high reports of pain last visit; core and pelvic tilts, gentle core progression, thoracic mobs, body mechanics functional squat with core,    PT Home Exercise Plan  Access Code: [email or web address]   Consulted and Agree with Plan of Care  Patient       Patient will benefit from skilled therapeutic intervention in order to improve the following deficits and impairments:  Decreased range of motion, Increased fascial restricitons, Decreased activity tolerance, Hypermobility, Pain, Decreased balance, Hypomobility, Impaired flexibility, Improper body mechanics, Decreased strength, Postural dysfunction, Decreased mobility  Visit Diagnosis: Chronic left-sided low back pain with left-sided sciatica  Muscle weakness (generalized)  Abnormal posture     Problem List Patient Active Problem List   Diagnosis Date Noted  . Spondylolysis of lumbar region 05/26/2019  . Heartburn 12/25/2018  . Moderate asthma 11/05/2018  . Anemia 05/20/2017  . Obesity due to excess calories without serious comorbidity 05/20/2017  . Intractable migraine with status migrainosus 02/16/2017  . Hypothyroidism, acquired 03/28/2016    7:45 AM,06/05/19 SSherol DadePT, DPT CMingusat BIotaPHYSICAL THERAPY DISCHARGE SUMMARY  Visits from Start of Care: 1  Current functional level related to goals / functional outcomes: See above for current status.  Pt didn't return to PT   Remaining deficits: See above for most current PT status.    Education / Equipment: HEP Plan: Patient agrees to discharge.  Patient goals were not met. Patient is being discharged due to not returning since the last visit.  ?????        KSigurd Sos PT 09/11/19 10:52 AM  [facility] [address] [place] [address]Phone: [phone]  Fax:  [phone] Name: NHalimah BewickMRN: [id number] of Birth: 710-May-1972

## 2019-06-06 ENCOUNTER — Other Ambulatory Visit: Payer: Self-pay

## 2019-06-06 ENCOUNTER — Telehealth: Payer: Self-pay | Admitting: Family Medicine

## 2019-06-06 NOTE — Telephone Encounter (Signed)
Patient is Calling to request if someone from the office can [facility] in [place] to request her medical record to be sent to the office. [phone] 0- is to request the records urgently.  Patient is requesting a call [phone]  Please advise thank you

## 2019-06-06 NOTE — Telephone Encounter (Signed)
See note

## 2019-06-09 ENCOUNTER — Other Ambulatory Visit: Payer: Self-pay

## 2019-06-09 ENCOUNTER — Ambulatory Visit (INDEPENDENT_AMBULATORY_CARE_PROVIDER_SITE_OTHER): Payer: Managed Care, Other (non HMO) | Admitting: Family Medicine

## 2019-06-09 ENCOUNTER — Encounter: Payer: Self-pay | Admitting: Family Medicine

## 2019-06-09 VITALS — BP 120/88 | HR 84 | Wt 227.0 lb

## 2019-06-09 DIAGNOSIS — M4316 Spondylolisthesis, lumbar region: Secondary | ICD-10-CM | POA: Diagnosis not present

## 2019-06-09 DIAGNOSIS — M5432 Sciatica, left side: Secondary | ICD-10-CM | POA: Diagnosis not present

## 2019-06-09 NOTE — Progress Notes (Signed)
Deanna Sellers is a 48 y.o. female who presents to [facility] today for follow-up back pain. Patient was last seen on November 6 for exacerbation of low back pain with lumbar/sacral radiculopathy thought to be left S1 dermatome.  MRI was obtained on November 15 of L-spine showing bulging/disc extrusion at L5-S1 potentially interfering with S1 nerve root.  Additionally showing L3-L4 spondylolisthesis with moderate bilateral L3 neuroforaminal stenosis and small right paracentral disc herniation at L4-5 impacting right lateral recess.  She has been treated with physical therapy and in the interim has had 2 visits but per last physical therapy note was still struggling with quite a bit of pain.   Patient states that she is having radiating symptoms in the left leg that have worsened since 05/28/2019. States that all of the pain is in posterior aspect of leg. Has tried exercises, Advil, lidocaine patches. Pain in 5/10 today. Pain increases with coughing or sneezing.    ROS:  As above  Exam:  BP 120/88    Pulse 84    Wt 227 lb (103 kg)    LMP 05/11/2019    SpO2 99%    BMI 38.36 kg/m  Wt Readings from Last 5 Encounters:  06/09/19 227 lb (103 kg)  05/23/19 227 lb 3.2 oz (103.1 kg)   General: Well Developed, well nourished, and in no acute distress.  Neuro/Psych: Alert and oriented x3, extra-ocular muscles intact, able to move all 4 extremities, sensation grossly intact. Skin: Warm and dry, no rashes noted.  Respiratory: Not using accessory muscles, speaking in full sentences, trachea midline.  Cardiovascular: Pulses palpable, no extremity edema. Abdomen: Does not appear distended. MSK: L-spine: Nontender to spinal midline.  Normal lumbar motion.  Lower extremity strength is intact normal gait.    Lab and Radiology Results Dg Lumbar Spine Complete  Result Date: 05/23/2019 CLINICAL DATA:  Left lower back pain with sciatica 1 month. No injury. EXAM: LUMBAR SPINE - COMPLETE 4+  VIEW COMPARISON:  None. FINDINGS: There is grade 1 anterolisthesis of L3 on L4 with bilateral L3 spondylolysis. Resultant moderate disc space narrowing at the L3-4 level. Minimal spondylosis of the lumbar spine. Facet arthropathy is present over the lower lumbar spine. 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 There is grade 1 anterolisthesis of L3 on L4 due to bilateral L3 spondylolysis. Electronically Signed   By: Elberta Fortis M.D.   On: 05/23/2019 15:02   Mr Lumbar Spine Wo Contrast  Result Date: 06/01/2019 CLINICAL DATA:  48 year old female with low back pain, left leg pain and weakness. Chronic but progressive symptoms for the past 1.5 months. No known injury. EXAM: MRI LUMBAR SPINE WITHOUT CONTRAST TECHNIQUE: Multiplanar, multisequence MR imaging of the lumbar spine was performed. No intravenous contrast was administered. COMPARISON:  Lumbar radiographs 05/23/2019. FINDINGS: Segmentation:  Normal on the comparison radiographs. Alignment: Grade 1 anterolisthesis of L3 on L4 appears stable measuring 7 millimeters. Subtle retrolisthesis at L4-L5 and L5-S1. Mild straightening of lower lumbar lordosis. Vertebrae: Chronic bilateral L3 pars fractures suspected. No marrow edema or evidence of acute osseous abnormality. Visualized bone marrow signal is within normal limits. Intact visible sacrum and SI joints. Conus medullaris and cauda equina: Conus extends to the L1 level. No lower spinal cord or conus signal abnormality. Paraspinal and other soft tissues: Negative; probable left ovary which is partially visible anterior to the psoas muscle on series 7, image 40 and sagittal series 6, image 15. Disc levels: T11-T12:  Chronic disc desiccation and anterior disc bulge with endplate spurring. No stenosis. T12-L1:  Negative. L1-L2:  Negative. L2-L3:  Negative disc. Mild facet hypertrophy. No stenosis. L3-L4: Disc desiccation and disc space loss with circumferential  disc/pseudo disc. Broad-based posterior component. Mild to moderate facet hypertrophy. No spinal or lateral recess stenosis but moderate bilateral L3 foraminal stenosis. L4-L5: Disc desiccation and mild disc space loss. Mild circumferential disc bulge with superimposed small right paracentral disc protrusion (series 7, image 29), annular fissure. Mild posterior element hypertrophy. Borderline to mild right lateral recess stenosis without spinal stenosis (right L5 nerve level). No foraminal stenosis. L5-S1: Circumferential disc bulge with mild endplate spurring. Superimposed small to moderate size disc extrusion into the left lateral recess (series 4, image 8 and series 7, image 35). Moderate left lateral recess stenosis (left S1 nerve level). No significant spinal stenosis. Mild left greater than right L5 foraminal stenosis. IMPRESSION: 1. Symptomatic level with regard to the left leg appears to be L5-S1 where there is a small-to-moderate disc extrusion into the left lateral recess. Query Left S1 radiculitis. 2. L3-L4 spondylolisthesis with chronic pars fractures, disc and posterior element degeneration. Subsequent moderate bilateral L3 neural foraminal stenosis. 3. Small right paracentral disc herniation at L4-L5 mildly affecting the right lateral recess. Electronically Signed   By: Odessa FlemingH  Hall M.D.   On: 06/01/2019 22:18   I, Clementeen GrahamEvan Jazz Biddy, personally (independently) visualized and performed the interpretation of the images attached in this note.     Assessment and Plan: 48 y.o. female with low back pain and left lumbosacral radiculopathy.  Despite the pars defect/spondylolisthesis seen on x-ray and MRI in the main pain generator is likely the disc herniation at L5-S1 impinging the left S1 nerve root.  Plan to proceed with epidural steroid injection at this level and recheck reevaluation following.  I spent 25 minutes with this patient, greater than 50% was face-to-face time counseling regarding MRI findings  potential surgical options epidural steroid injection and backup plan.Marland Kitchen.   PDMP not reviewed this encounter. Orders Placed This Encounter  Procedures   DG Epidural/Nerve Root    Standing Status:   Future    Standing Expiration Date:   08/08/2020    Scheduling Instructions:     Pt will be only available 1st week of December. Please try to work in    Government social research officerrder Specific Question:   Reason for Exam (SYMPTOM  OR DIAGNOSIS REQUIRED)    Answer:   L5S1 for left sciatica.    Order Specific Question:   Is the patient pregnant?    Answer:   No    Order Specific Question:   Preferred imaging location?    Answer:   [id number]   No orders of the defined types were placed in this encounter.   Historical information moved to improve visibility of documentation.  Past Medical History:  Diagnosis Date   Abnormal Pap smear of cervix    Anemia    Asthma    HLD (hyperlipidemia)    Hypothyroidism    Migraine    Urticaria    Vitamin D deficiency    Past Surgical History:  Procedure Laterality Date   CESAREAN SECTION N/A 1993   TUBAL LIGATION     Social History   Tobacco Use   Smoking status: Never Smoker   Smokeless tobacco: Never Used  Substance Use Topics   Alcohol use: Never    Frequency: Never   family history includes Heart attack in her father and paternal grandmother;  Hypertension in her mother; Kidney disease in her father; Migraines in her brother and sister; Thyroid disease in her mother.  Medications: Current Outpatient Medications  Medication Sig Dispense Refill   albuterol (PROAIR HFA) 108 (90 Base) MCG/ACT inhaler ProAir HFA 90 mcg/actuation aerosol inhaler     budesonide-formoterol (SYMBICORT) 80-4.5 MCG/ACT inhaler Symbicort 80 mcg-4.5 mcg/actuation HFA aerosol inhaler     cetirizine (ZYRTEC) 10 MG tablet TAKE ONE TABLET BY MOUTH EVERY DAY     EPINEPHrine 0.3 mg/0.3 mL IJ SOAJ injection Inject into the muscle.     famotidine (PEPCID) 40 MG tablet  Take by mouth.     ferrous sulfate 325 (65 FE) MG tablet Take by mouth.     fluconazole (DIFLUCAN) 200 MG tablet Take one tab every 3 days x 3 for yeast infection if needed.     hydrochlorothiazide (HYDRODIURIL) 25 MG tablet Take 1/2 - 1 tab po in the morning.     ipratropium-albuterol (DUONEB) 0.5-2.5 (3) MG/3ML SOLN Inhale into the lungs.     levothyroxine (SYNTHROID) 137 MCG tablet Synthroid 137 mcg tablet  Take 1 tablet every day by oral route.     montelukast (SINGULAIR) 10 MG tablet Take by mouth.     omeprazole (PRILOSEC) 40 MG capsule Take by mouth.     No current facility-administered medications for this visit.    Allergies  Allergen Reactions   Shellfish Allergy Anaphylaxis   Cinnamon Itching   Latex Hives   Povidone Iodine       Discussed warning signs or symptoms. Please see discharge instructions. Patient expresses understanding.  The above documentation has been reviewed and is accurate and complete Lynne Leader

## 2019-06-09 NOTE — Patient Instructions (Signed)
Thank you for coming in today. You should hear from [facility] about the back soon.  Ok to call at [phone] and schedule your back injection.  Let me know if you have problems or cannot get scheduled when you are around.   Keep me updated.    Epidural Steroid Injection An epidural steroid injection is a shot of steroid medicine and numbing medicine that is given into the space between the spinal cord and the bones in your back (epidural space). The shot helps relieve pain caused by an irritated or swollen nerve root. The amount of pain relief you get from the injection depends on what is causing the nerve to be swollen and irritated, and how long your pain lasts. You are more likely to benefit from this injection if your pain is strong and comes on suddenly rather than if you have had pain for a long time. Tell a health care provider about:   Any allergies you have.  All medicines you are taking, including vitamins, herbs, eye drops, creams, and over-the-counter medicines.  Any problems you or family members have had with anesthetic medicines.  Any blood disorders you have.  Any surgeries you have had.  Any medical conditions you have.  Whether you are pregnant or may be pregnant. What are the risks? Generally, this is a safe procedure. However, problems may occur, including:  Headache.  Bleeding.  Infection.  Allergic reaction to medicines.  Damage to your nerves. What happens before the procedure? Staying hydrated Follow instructions from your health care provider about hydration, which may include:  Up to 2 hours before the procedure - you may continue to drink clear liquids, such as water, clear fruit juice, black coffee, and plain tea. Eating and drinking restrictions Follow instructions from your health care provider about eating and drinking, which may include:  8 hours before the procedure - stop eating heavy meals or foods such as meat, fried  foods, or fatty foods.  6 hours before the procedure - stop eating light meals or foods, such as toast or cereal.  6 hours before the procedure - stop drinking milk or drinks that contain milk.  2 hours before the procedure - stop drinking clear liquids. Medicine  You may be given medicines to lower anxiety.  Ask your health care provider about: ? Changing or stopping your regular medicines. This is especially important if you are taking diabetes medicines or blood thinners. ? Taking medicines such as aspirin and ibuprofen. These medicines can thin your blood. Do not take these medicines before your procedure if your health care provider instructs you not to. General instructions  Plan to have someone take you home from the hospital or clinic. What happens during the procedure?  You may receive a medicine to help you relax (sedative).  You will be asked to lie on your abdomen.  The injection site will be cleaned.  A numbing medicine (local anesthetic) will be used to numb the injection site.  A needle will be inserted through your skin into the epidural space. You may feel some discomfort when this happens. An X-ray machine will be used to make sure the needle is put as close as possible to the affected nerve.  A steroid medicine and a local anesthetic will be injected into the epidural space.  The needle will be removed.  A bandage (dressing) will be put over the injection site. What happens after the procedure?  Your blood pressure, heart rate, breathing rate,  and blood oxygen level will be monitored until the medicines you were given have worn off.  Your arm or leg may feel weak or numb for a few hours.  The injection site may feel sore.  Do not drive for 24 hours if you received a sedative. This information is not intended to replace advice given to you by your health care provider. Make sure you discuss any questions you have with your health care provider. Document  Released: 10/10/2007 Document Revised: 06/15/2017 Document Reviewed: 10/19/2015 Elsevier Patient Education  2020 [employer].

## 2019-06-10 DIAGNOSIS — M4316 Spondylolisthesis, lumbar region: Secondary | ICD-10-CM | POA: Insufficient documentation

## 2019-06-16 ENCOUNTER — Encounter: Payer: Managed Care, Other (non HMO) | Admitting: Physical Therapy

## 2019-06-18 ENCOUNTER — Encounter: Payer: Managed Care, Other (non HMO) | Admitting: Physical Therapy

## 2019-06-19 ENCOUNTER — Other Ambulatory Visit: Payer: Self-pay

## 2019-06-19 ENCOUNTER — Ambulatory Visit
Admission: RE | Admit: 2019-06-19 | Discharge: 2019-06-19 | Disposition: A | Discharge status: Home / Self Care | Payer: Managed Care, Other (non HMO) | Source: Ambulatory Visit | Attending: Family Medicine | Admitting: Family Medicine

## 2019-06-19 DIAGNOSIS — M5432 Sciatica, left side: Secondary | ICD-10-CM

## 2019-06-19 IMAGING — XA DG EPIDURAL NERVE ROOT
2 series · 2 of 2 positions shown · non-contrast
Comparison: none

CLINICAL DATA: Lumbosacral spondylosis without myelopathy with
radiculopathy. Left buttock and posterior leg pain to the foot. Disc
protrusion at L5-S1 affecting the left S1 nerve root.

[Series 3: ortho adipose · 1 of 1 slices shown (1 of 2)]
[im 1/1]
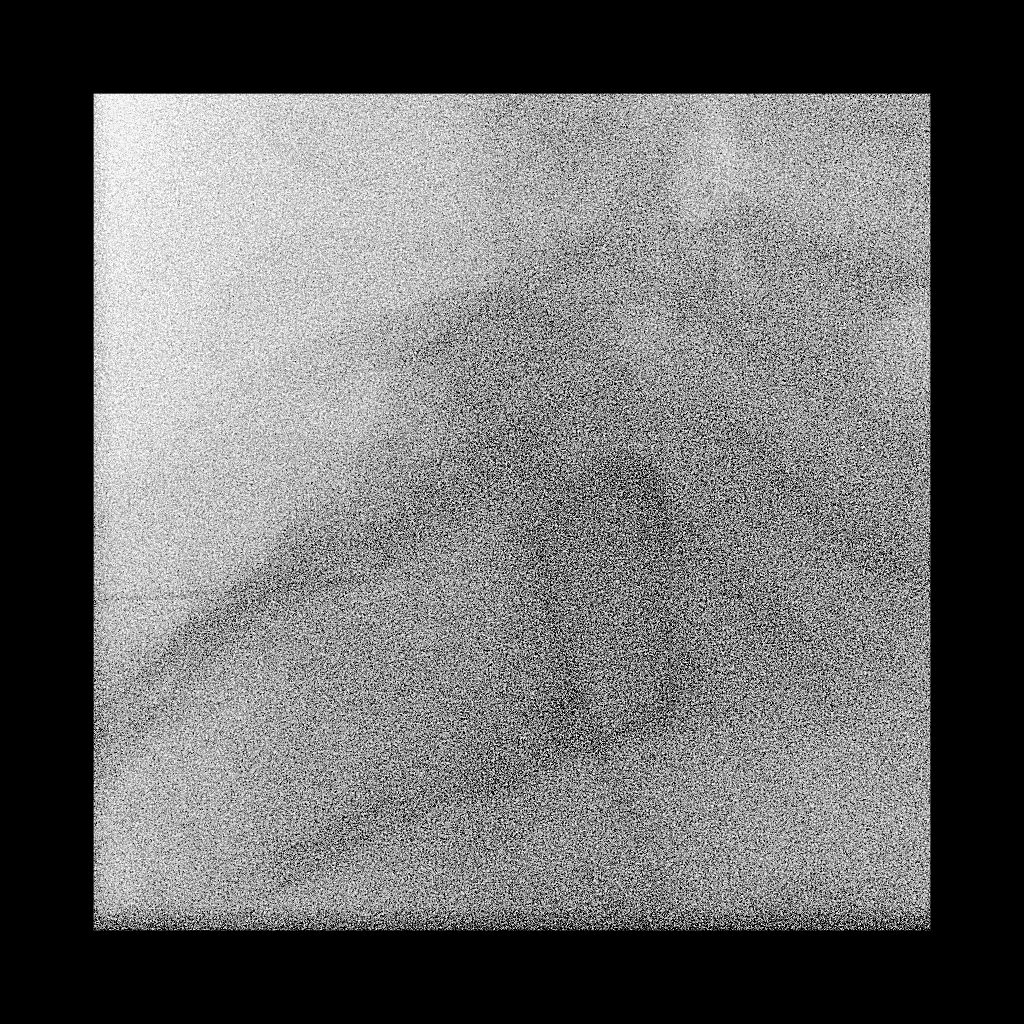

[Series 4: ortho adipose · 1 of 1 slices shown (2 of 2)]
[im 1/1]
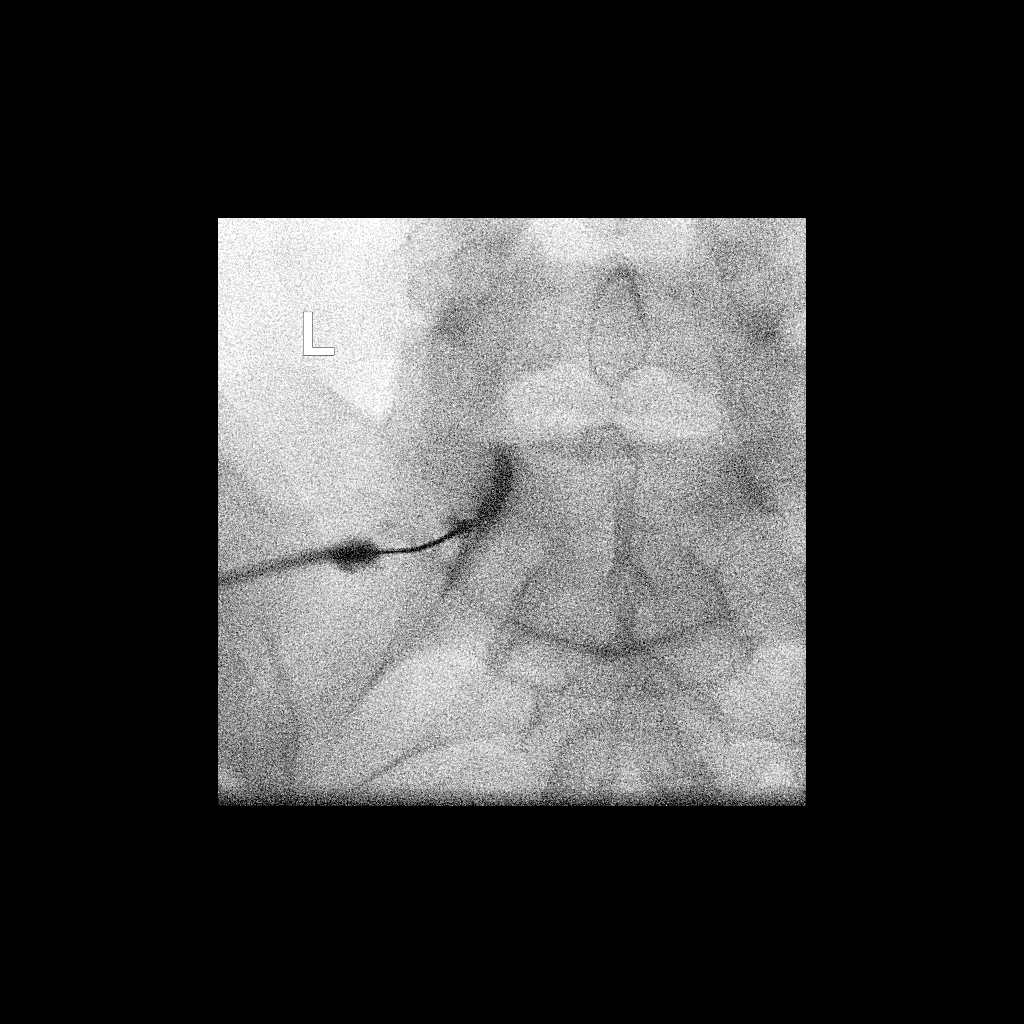

[2 of 2 positions shown; findings below may reference images not displayed]

EXAM:
EPIDURAL/NERVE ROOT

FLUOROSCOPY TIME:  Radiation Exposure Index (as provided by the
fluoroscopic device): 13.5 mGy

Fluoroscopy Time:  25 seconds

Number of Acquired Images:  0

PROCEDURE:
The procedure, risks, benefits, and alternatives were explained to
the patient. Questions regarding the procedure were encouraged and
answered. The patient understands and consents to the procedure.

LEFT S1 NERVE ROOT BLOCK AND TRANSFORAMINAL EPIDURAL: A posterior
oblique approach was taken to the intervertebral foramen on the left
at S1 using a 5 inch 22 gauge spinal needle. Injection of Isovue M
200 outlined the left S1 nerve root and showed good epidural spread.
No vascular opacification is seen. 120 mg of Depo-Medrol mixed with
2 mL of 1% lidocaine were instilled. The procedure was
well-tolerated, and the patient was discharged thirty minutes
following the injection in good condition.

COMPLICATIONS:
None immediate.
IMPRESSION: Technically successful injection consisting of a left S1 nerve root
block and transforaminal epidural.

## 2019-06-19 MED ORDER — IOPAMIDOL (ISOVUE-M 200) INJECTION 41%
1.0000 mL | Freq: Once | INTRAMUSCULAR | Status: AC
  Administered 2019-06-19: 1 mL via EPIDURAL

## 2019-06-19 MED ORDER — METHYLPREDNISOLONE ACETATE 40 MG/ML INJ SUSP (RADIOLOG
120.0000 mg | Freq: Once | INTRAMUSCULAR | Status: AC
  Administered 2019-06-19: 120 mg via EPIDURAL

## 2019-06-19 NOTE — Discharge Instructions (Signed)

## 2019-06-27 ENCOUNTER — Encounter: Payer: Managed Care, Other (non HMO) | Admitting: Physical Therapy

## 2019-07-07 ENCOUNTER — Encounter: Payer: Managed Care, Other (non HMO) | Admitting: Physical Therapy

## 2019-07-23 ENCOUNTER — Encounter: Payer: Managed Care, Other (non HMO) | Admitting: Physical Therapy

## 2019-09-18 ENCOUNTER — Ambulatory Visit (INDEPENDENT_AMBULATORY_CARE_PROVIDER_SITE_OTHER): Payer: Managed Care, Other (non HMO) | Admitting: Sports Medicine

## 2019-09-18 ENCOUNTER — Encounter: Payer: Self-pay | Admitting: Sports Medicine

## 2019-09-18 ENCOUNTER — Other Ambulatory Visit: Payer: Self-pay

## 2019-09-18 DIAGNOSIS — M47816 Spondylosis without myelopathy or radiculopathy, lumbar region: Secondary | ICD-10-CM

## 2019-09-18 MED ORDER — PREDNISONE 50 MG PO TABS: ORAL_TABLET | ORAL | 0 refills | Status: DC

## 2019-09-18 MED ORDER — KETOROLAC TROMETHAMINE 30 MG/ML IJ SOLN
30.0000 mg | Freq: Once | INTRAMUSCULAR | Status: AC
  Administered 2019-09-18: 30 mg via INTRAMUSCULAR

## 2019-09-18 MED ORDER — HYDROCODONE-ACETAMINOPHEN 5-325 MG PO TABS: 1.0000 | ORAL_TABLET | Freq: Three times a day (TID) | ORAL | 0 refills | Status: DC | PRN

## 2019-09-18 MED ORDER — PREGABALIN 75 MG PO CAPS: 75.0000 mg | ORAL_CAPSULE | Freq: Two times a day (BID) | ORAL | 3 refills | Status: DC

## 2019-09-18 NOTE — Progress Notes (Signed)
    Procedures performed today:    None.  Independent interpretation of notes and tests performed by another provider:   None.  Impression and Recommendations:    Lumbar spondylosis Multilevel lumbar spondylosis, bilateral radiculitis, left S1 distribution, she has had a left selective S1 epidural without any relief. We are going to proceed with a left L5-S1 interlaminar epidural, 5 days of prednisone, 5 days of hydrocodone, Lyrica. Toradol 30 intramuscular today. Return to see me 1 month after injection.    ___________________________________________ Ihor Austin. Benjamin Stain, M.D., ABFM., CAQSM. Primary Care and Sports Medicine [facility]  Adjunct Instructor of Family Medicine  [employer]

## 2019-09-18 NOTE — Addendum Note (Signed)
Addended by: Donne Anon L on: 09/18/2019 11:20 AM   Modules accepted: Orders

## 2019-09-18 NOTE — Assessment & Plan Note (Signed)
Multilevel lumbar spondylosis, bilateral radiculitis, left S1 distribution, she has had a left selective S1 epidural without any relief. We are going to proceed with a left L5-S1 interlaminar epidural, 5 days of prednisone, 5 days of hydrocodone, Lyrica. Toradol 30 intramuscular today. Return to see me 1 month after injection.

## 2019-09-23 ENCOUNTER — Other Ambulatory Visit: Payer: Managed Care, Other (non HMO)

## 2019-09-29 ENCOUNTER — Ambulatory Visit
Admission: RE | Admit: 2019-09-29 | Discharge: 2019-09-29 | Disposition: A | Discharge status: Home / Self Care | Payer: Managed Care, Other (non HMO) | Source: Ambulatory Visit | Attending: Sports Medicine | Admitting: Sports Medicine

## 2019-09-29 ENCOUNTER — Other Ambulatory Visit: Payer: Self-pay

## 2019-09-29 DIAGNOSIS — M47816 Spondylosis without myelopathy or radiculopathy, lumbar region: Secondary | ICD-10-CM

## 2019-09-29 IMAGING — XA Imaging study
2 series · 2 of 2 positions shown · non-contrast
Comparison: none

CLINICAL DATA: Spondylosis without myelopathy. Left-sided disc
herniation L5-S1. Improvement following the initial left S1
injection but with recurrence of symptoms. Interlaminar approach
requested.

[Series 1: ortho standard · 1 of 1 slices shown (1 of 2)]
[im 1/1]
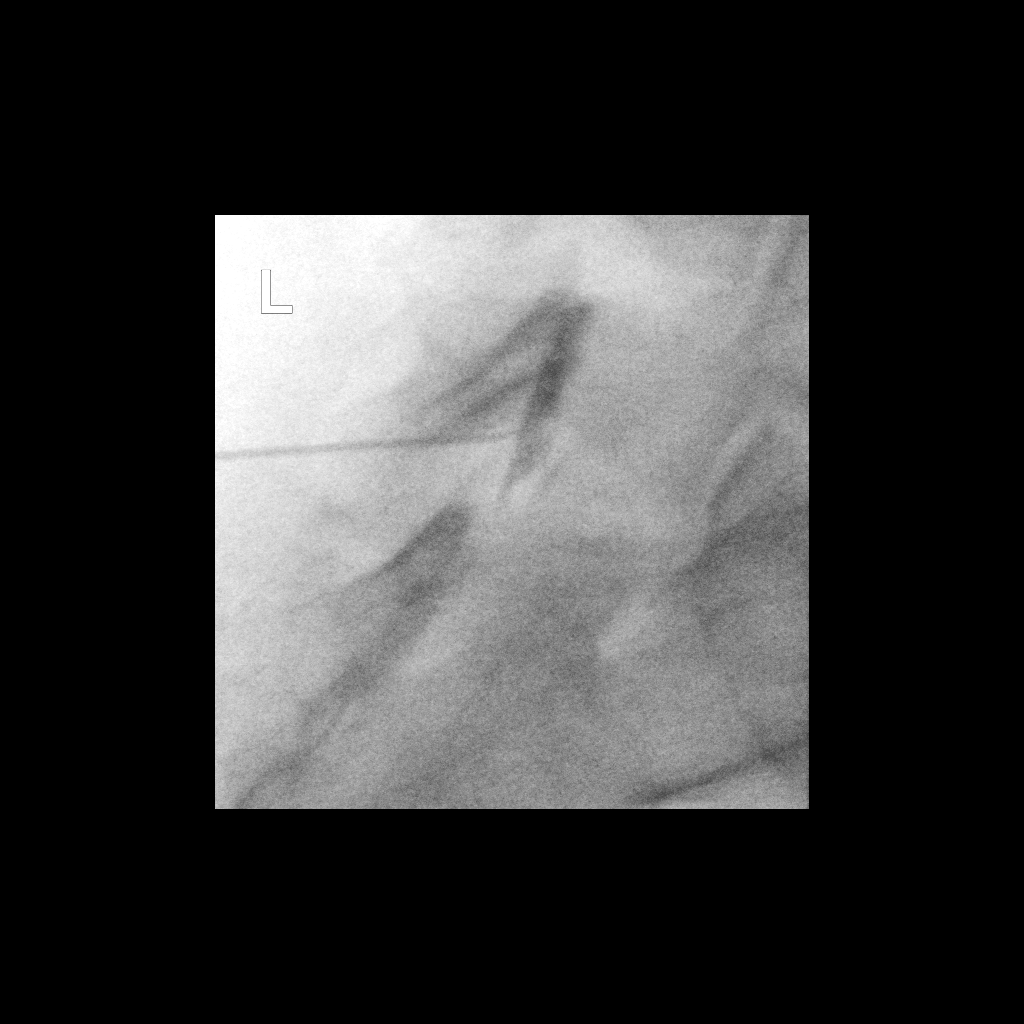

[Series 2: ortho standard · 1 of 1 slices shown (2 of 2)]
[im 1/1]
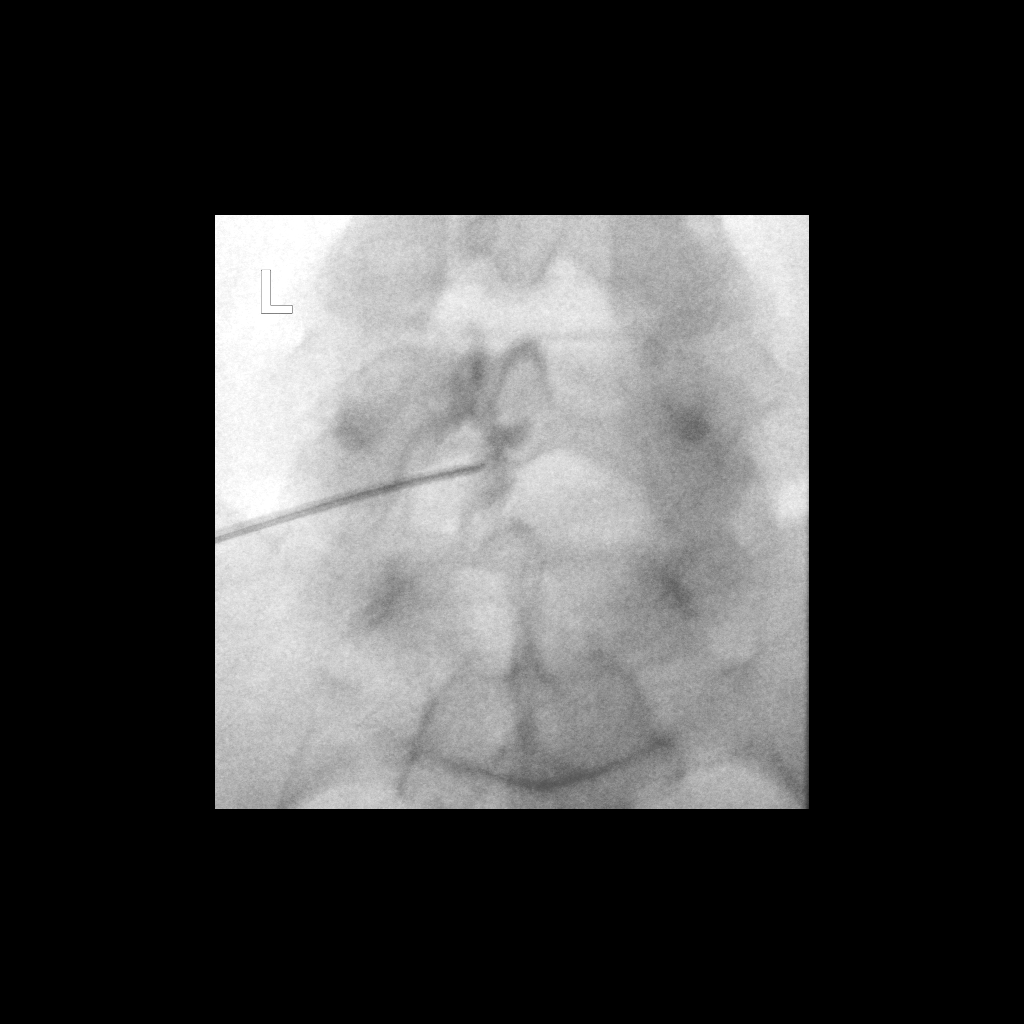

[2 of 2 positions shown; findings below may reference images not displayed]

FLUOROSCOPY TIME:  0 minutes 25 seconds. 78.14 micro gray meter
squared

PROCEDURE:
The procedure, risks, benefits, and alternatives were explained to
the patient. Questions regarding the procedure were encouraged and
answered. The patient understands and consents to the procedure.

LUMBAR EPIDURAL INJECTION:

An interlaminar approach was performed on the left at L5-S1. The
overlying skin was cleansed and anesthetized. A 6 inch 20 gauge
epidural needle was advanced using loss-of-resistance technique.

DIAGNOSTIC EPIDURAL INJECTION:

Injection of Isovue-M 200 shows a good epidural pattern with spread
above and below the level of needle placement, primarily on the
left. No vascular opacification is seen.

THERAPEUTIC EPIDURAL INJECTION:

One hundred twenty mg of Depo-Medrol mixed with 2.5 cc 1% lidocaine
were instilled. The procedure was well-tolerated, and the patient
was discharged thirty minutes following the injection in good
condition.

COMPLICATIONS:
None
IMPRESSION: Technically successful epidural injection on the left at L5-S1 # 2.

## 2019-09-29 MED ORDER — METHYLPREDNISOLONE ACETATE 40 MG/ML INJ SUSP (RADIOLOG
120.0000 mg | Freq: Once | INTRAMUSCULAR | Status: AC
  Administered 2019-09-29: 120 mg via EPIDURAL

## 2019-09-29 MED ORDER — IOPAMIDOL (ISOVUE-M 200) INJECTION 41%
1.0000 mL | Freq: Once | INTRAMUSCULAR | Status: AC
  Administered 2019-09-29: 1 mL via EPIDURAL

## 2019-09-29 NOTE — Discharge Instructions (Signed)

## 2019-11-27 ENCOUNTER — Other Ambulatory Visit: Payer: Self-pay

## 2019-11-27 ENCOUNTER — Ambulatory Visit (INDEPENDENT_AMBULATORY_CARE_PROVIDER_SITE_OTHER)
Admission: RE | Admit: 2019-11-27 | Discharge: 2019-11-27 | Disposition: A | Discharge status: Home / Self Care | Payer: Managed Care, Other (non HMO) | Source: Ambulatory Visit | Attending: Family Medicine | Admitting: Family Medicine

## 2019-11-27 ENCOUNTER — Ambulatory Visit: Payer: Managed Care, Other (non HMO) | Admitting: Family Medicine

## 2019-11-27 ENCOUNTER — Encounter: Payer: Self-pay | Admitting: Family Medicine

## 2019-11-27 ENCOUNTER — Ambulatory Visit: Payer: Self-pay

## 2019-11-27 VITALS — BP 110/78 | HR 89 | Ht 64.5 in | Wt 216.4 lb

## 2019-11-27 DIAGNOSIS — M25562 Pain in left knee: Secondary | ICD-10-CM

## 2019-11-27 DIAGNOSIS — M4316 Spondylolisthesis, lumbar region: Secondary | ICD-10-CM | POA: Diagnosis not present

## 2019-11-27 DIAGNOSIS — M47816 Spondylosis without myelopathy or radiculopathy, lumbar region: Secondary | ICD-10-CM

## 2019-11-27 DIAGNOSIS — M5432 Sciatica, left side: Secondary | ICD-10-CM | POA: Insufficient documentation

## 2019-11-27 IMAGING — DX DG KNEE AP/LAT W/ SUNRISE*L*
3 series · 3 of 3 positions shown · non-contrast
Comparison: None.

CLINICAL DATA: Pain and stiffness

EXAM:
LEFT KNEE 3 VIEWS

[knee ap]
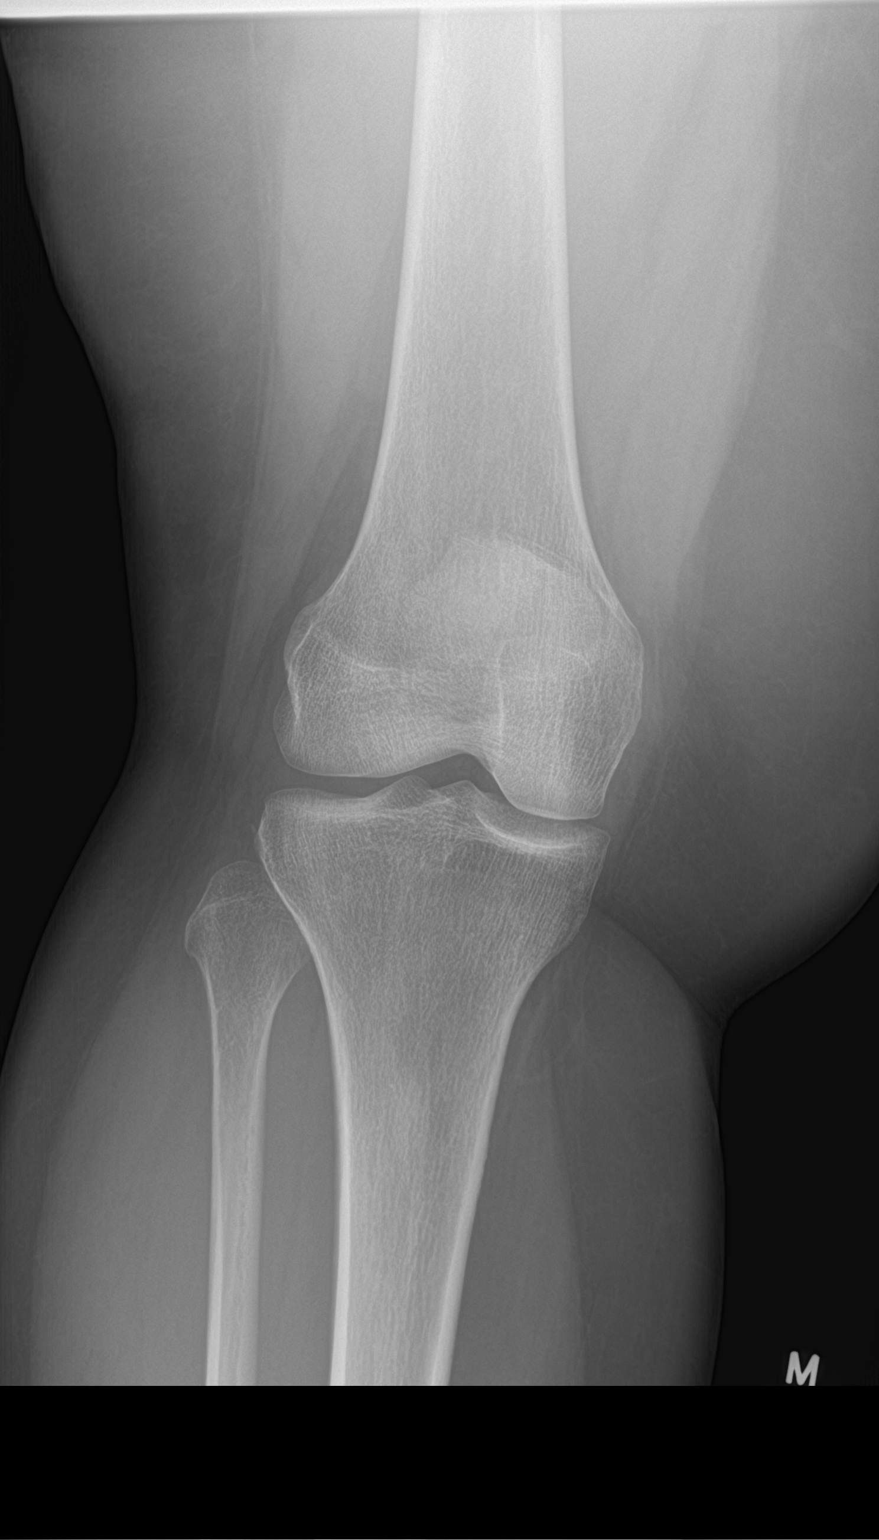

[knee lat]
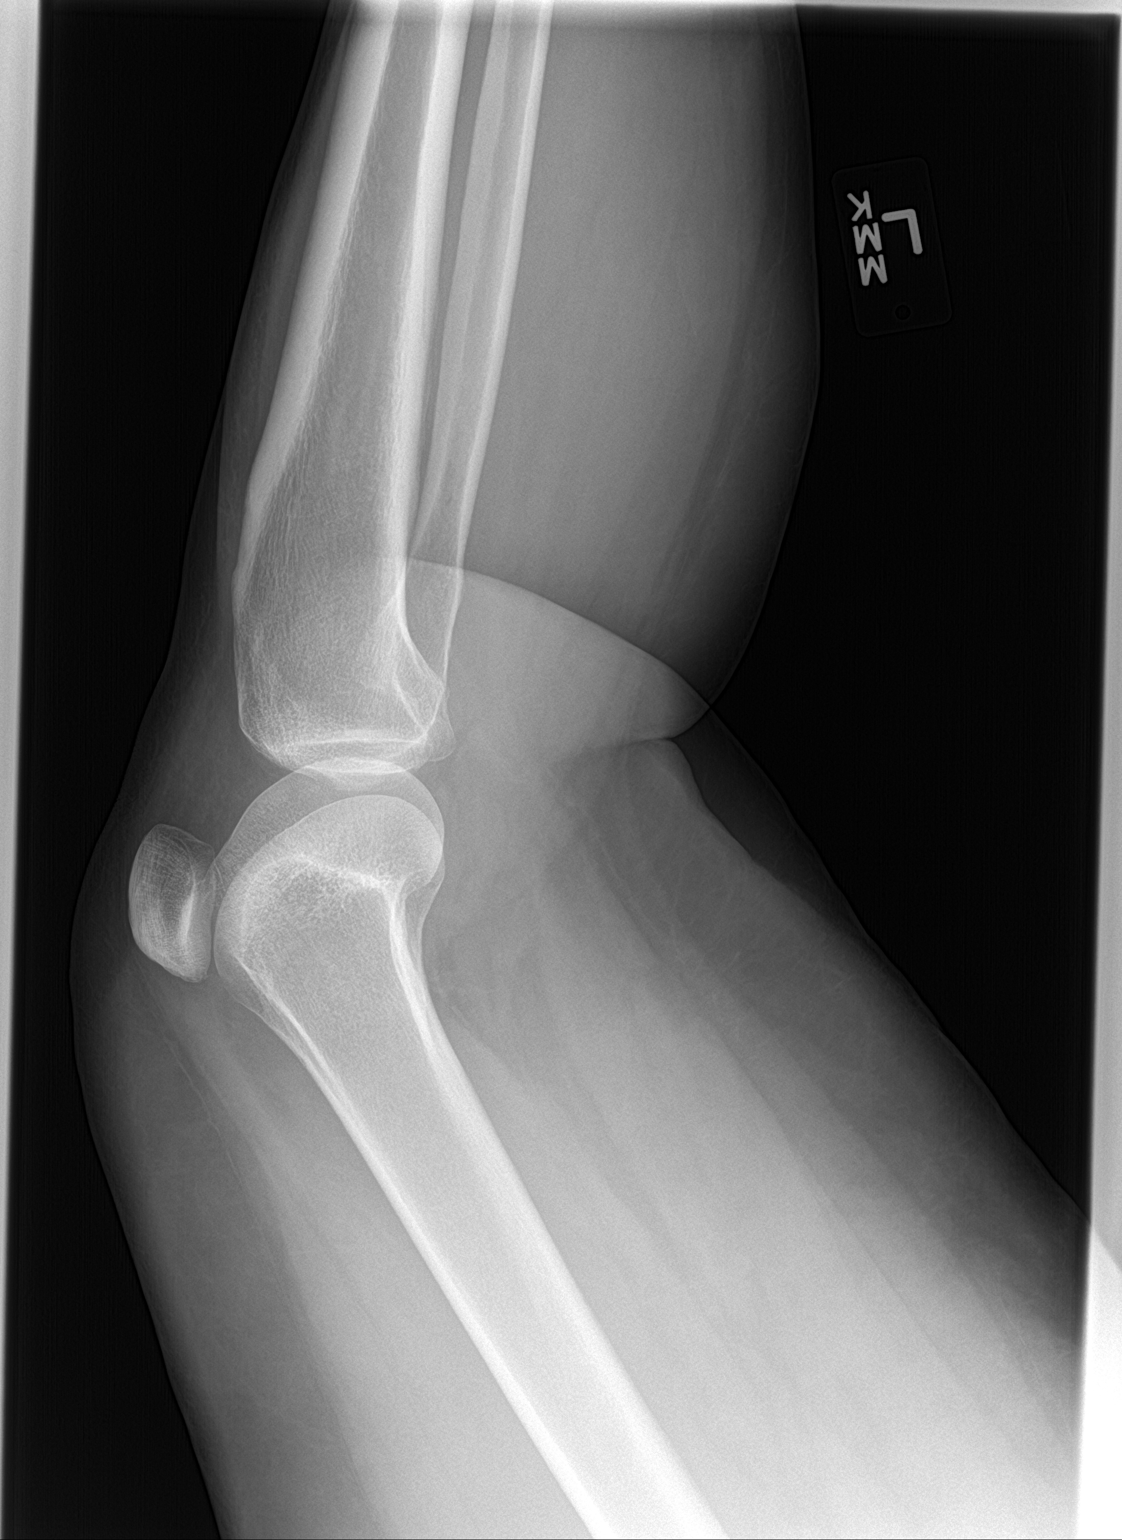

[sunrise]
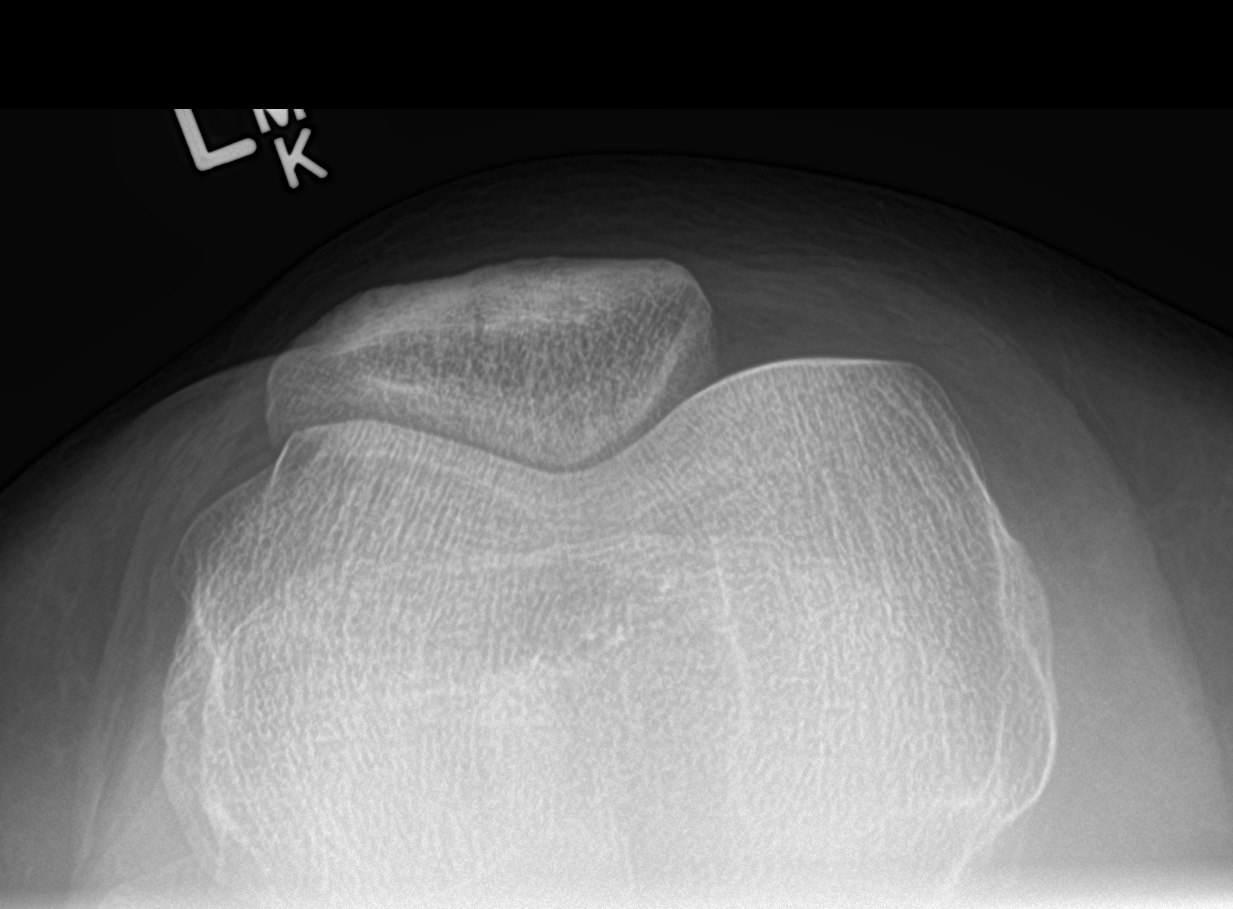

[3 of 3 positions shown; findings below may reference images not displayed]

FINDINGS: Standing frontal, lateral, and sunrise patellar images were
obtained. No fracture, dislocation, or joint effusion. Joint spaces
appear normal. No erosive change.
IMPRESSION: No fracture, dislocation, or joint effusion. No evident arthropathy.

## 2019-11-27 MED ORDER — PREGABALIN 75 MG PO CAPS: 75.0000 mg | ORAL_CAPSULE | Freq: Two times a day (BID) | ORAL | 3 refills | Status: DC

## 2019-11-27 NOTE — Progress Notes (Signed)
I, [name], LAT, ATC, am serving as scribe for Dr. [name].  Deanna Sellers is a 49 y.o. female who presents to [facility] at [facility] today for L knee pain since Oct/Nov 2020.  She was last seen by Dr. [name] on 06/09/19 for low back pain and L leg pain.  Since then, pt reports that she's con't to have knee pain since she was being seen for her low back and L leg pain.  She states that the lower leg pain resolved but her L knee pain remains.  She locates her pain to her L posterior knee.  Knee swelling: No Knee mechanical symptoms: No Aggravating factors: bothers her at night in bed if she tries to roll over in bed; attempts at seated h/s stretching Treatments tried: Nothing for knee specifically.  For radiculopathy has had 2 epidural steroid injections.  Most recent March 2021 which reduced her pain radiating down the entirety of her back of her leg to just the posterior knee.   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   MSK: Left knee normal-appearing with no effusion swelling erythema or deformity. Range of motion 0-120 degrees without crepitation. Nontender. Stable ligamentous exam. Negative Murray's test. Intact strength flexion and extension.  L-spine: Normal with the exception of positive slump test left side. Lower extremity strength is intact as are reflexes and sensation.    Lab and Radiology Results  EXAM: MRI LUMBAR SPINE WITHOUT CONTRAST  TECHNIQUE: Multiplanar, multisequence MR imaging of the lumbar spine was performed. No intravenous contrast was administered.  COMPARISON:  Lumbar  radiographs 05/23/2019.  FINDINGS: Segmentation:  Normal on the comparison radiographs.  Alignment: Grade 1 anterolisthesis of L3 on L4 appears stable measuring 7 millimeters. Subtle retrolisthesis at L4-L5 and L5-S1. Mild straightening of lower lumbar lordosis.  Vertebrae: Chronic bilateral L3 pars fractures suspected. No marrow edema or evidence of acute osseous abnormality. Visualized bone marrow signal is within normal limits. Intact visible sacrum and SI joints.  Conus medullaris and cauda equina: Conus extends to the L1 level. No lower spinal cord or conus signal abnormality.  Paraspinal and other soft tissues: Negative; probable left ovary which is partially visible anterior to the psoas muscle on series 7, image 40 and sagittal series 6, image 15.  Disc levels:  T11-T12: Chronic disc desiccation and anterior disc bulge with endplate spurring. No stenosis.  T12-L1:  Negative.  L1-L2:  Negative.  L2-L3:  Negative disc. Mild facet hypertrophy. No stenosis.  L3-L4: Disc desiccation and disc space loss with circumferential disc/pseudo disc. Broad-based posterior component. Mild to moderate facet hypertrophy. No spinal or lateral recess stenosis but moderate bilateral L3 foraminal stenosis.  L4-L5: Disc desiccation and mild disc space loss. Mild circumferential disc bulge with superimposed small right paracentral disc protrusion (series 7, image 29), annular fissure. Mild posterior element hypertrophy. Borderline to mild right lateral recess stenosis without spinal stenosis (right L5 nerve level). No foraminal stenosis.  L5-S1: Circumferential disc bulge with mild endplate spurring. Superimposed small to moderate size disc extrusion into the left lateral recess (series 4, image 8  and series 7, image 35). Moderate left lateral recess stenosis (left S1 nerve level). No significant spinal stenosis. Mild left greater than right L5 foraminal  stenosis.  IMPRESSION: 1. Symptomatic level with regard to the left leg appears to be L5-S1 where there is a small-to-moderate disc extrusion into the left lateral recess. Query Left S1 radiculitis. 2. L3-L4 spondylolisthesis with chronic pars fractures, disc and posterior element degeneration. Subsequent moderate bilateral L3 neural foraminal stenosis. 3. Small right paracentral disc herniation at L4-L5 mildly affecting the right lateral recess.   Electronically Signed   By: Odessa Fleming M.D.   On: 06/01/2019 22:18  DG INJECT DIAG/THERA/INC NEEDLE/CATH/PLC EPI/LUMB/SAC W/IMG  Result Date: 09/29/2019 CLINICAL DATA:  Spondylosis without myelopathy. Left-sided disc herniation L5-S1. Improvement following the initial left S1 injection but with recurrence of symptoms. Interlaminar approach requested. FLUOROSCOPY TIME:  0 minutes 25 seconds. 78.14 micro gray meter squared PROCEDURE: The procedure, risks, benefits, and alternatives were explained to the patient. Questions regarding the procedure were encouraged and answered. The patient understands and consents to the procedure. LUMBAR EPIDURAL INJECTION: An interlaminar approach was performed on the left at L5-S1. The overlying skin was cleansed and anesthetized. A 6 inch 20 gauge epidural needle was advanced using loss-of-resistance technique. DIAGNOSTIC EPIDURAL INJECTION: Injection of Isovue-M 200 shows a good epidural pattern with spread above and below the level of needle placement, primarily on the left. No vascular opacification is seen. THERAPEUTIC EPIDURAL INJECTION: One hundred twenty mg of Depo-Medrol mixed with 2.5 cc 1% lidocaine were instilled. The procedure was well-tolerated, and the patient was discharged thirty minutes following the injection in good condition. COMPLICATIONS: None IMPRESSION: Technically successful epidural injection on the left at L5-S1 # 2. Electronically Signed   By: Paulina Fusi M.D.   On: 09/29/2019 09:40   I,  [name], personally (independently) visualized and performed the interpretation of the MRI images attached in this note.  X-ray images left knee obtained today personally and independently reviewed Medial compartment DJD mild.  No acute fractures.  Slightly low riding patella Await formal radiology review  Diagnostic Limited MSK Ultrasound of: Knee Quad tendon intact normal-appearing no significant joint effusion. Patellar tendon normal. Medial and lateral joint lines largely normal-appearing without significant obvious meniscus tear. Posterior knee does not reveal Baker's cyst. Isolated view of hamstring tendon distally looks normal.  Normal on dynamic imaging and with resisted strength testing. Impression: Normal knee ultrasound   Assessment and Plan: 49 y.o. female with left posterior knee pain.  Doubtful that she has an isolated orthopedic knee injury or issue causing the posterior knee pain.  I believe she is having some patchy residual pain in the posterior aspect of her knee from remaining S1 radiculopathy. She has had pretty good results with epidural steroid injections in the past at left L5-S1. Plan for x-ray as above to continue to further evaluate and potentially rule out knee injury or issue as cause of posterior knee pain. Spent time discussing potential course of treatment and further diagnosis for her remaining pain. Plan to try Lyrica at bedtime.  This may be somewhat helpful.  She is pretty sure she never actually tried it at the last visit with Dr. Benjamin Stain. Additionally if needed could proceed with nerve conduction study although after describing the procedure she thinks her symptoms are not bad enough at this time to warrant it. Additionally if needed in the future could proceed with repeat epidural steroid injection.  She let me know if needed. Lastly if  we really need to continue to rule out knee pathology as cause of pain could proceed with knee MRI although I  think that is not necessary at this point.   Orders Placed This Encounter  Procedures  . [place] LIMITED JOINT SPACE STRUCTURES LOW LEFT(NO LINKED CHARGES)    Order Specific Question:   Reason for Exam (SYMPTOM  OR DIAGNOSIS REQUIRED)    Answer:   L knee pain    Order Specific Question:   Preferred imaging location?    Answer:   [facility]  . DG Knee AP/LAT W/Sunrise Left    Standing Status:   Future    Number of Occurrences:   1    Standing Expiration Date:   01/26/2021    Order Specific Question:   Reason for Exam (SYMPTOM  OR DIAGNOSIS REQUIRED)    Answer:   eval knee pain    Order Specific Question:   Is patient pregnant?    Answer:   No    Order Specific Question:   Preferred imaging location?    Answer:   [facility]    Order Specific Question:   Radiology Contrast Protocol - do NOT remove file path    Answer:   [email or web address]   Meds ordered this encounter  Medications  . pregabalin (LYRICA) 75 MG capsule    Sig: Take 1 capsule (75 mg total) by mouth 2 (two) times daily.    Dispense:  60 capsule    Refill:  3     Discussed warning signs or symptoms. Please see discharge instructions. Patient expresses understanding.   The above documentation has been reviewed and is accurate and complete Lynne Leader

## 2019-11-27 NOTE — Patient Instructions (Addendum)
Thank you for coming in today.  Take lyrica up to 2 x daily but mostly at bedtime as needed.  Plan for knee xray to be more sure that the pain is not coming from the knee.  Could do another injection if needed.  Let me know if you want an injury.

## 2019-11-28 NOTE — Progress Notes (Signed)
X-ray looks pretty normal to radiology.

## 2020-06-07 NOTE — Progress Notes (Addendum)
I, Deanna Sellers, LAT, ATC, am serving as scribe for Dr. [name].  Deanna Sellers is a 49 y.o. female who presents to [facility] at [facility] today for sciatica.  She was last seen by Dr. [name] on 11/27/19 for f/u of L-sided sciatica and L knee pain and was prescribed Lyrica.  She has had 2 prior epidurals for her radiculopathy w/ the most recent being on 09/29/19 (left L5-S1) Since then, pt reports return of L leg pain that has been worsening over the last 3 months.  She locates her most significant pain to her L post knee but has radiating pain running from her L low back/buttock that radiates down her entire L post LE w/ the knee and the calf being the worst.  She states that she's had 2 epidurals that have only provided approximately one week's worth of symptom relief but not complete relief.  She's been taking IbU 800mg  at night to help her sleep.  She does not feel that the Gabapentin or the Lyrica help/work and she is no longer taking either of these medications.  Diagnostic imaging: L-spine MRI- 06/01/19; L-spine XR- 05/23/19  Pertinent review of systems: No fevers or chills  Relevant historical information: Migraine history.  History spondylolisthesis L3-L4 with bilateral pars defect.   Exam:  BP 134/88 (BP Location: Left Arm, Patient Position: Sitting, Cuff Size: Large)   Pulse 79   Ht 5' 4.5" (1.638 m)   Wt 224 lb (101.6 kg)   SpO2 99%   BMI 37.86 kg/m  General: Well Developed, well nourished, and in no acute distress.   MSK: L-spine normal-appearing mildly tender palpation left lumbar paraspinal musculature. Normal gait     Lab and Radiology Results EXAM: MRI LUMBAR SPINE WITHOUT CONTRAST  TECHNIQUE: Multiplanar, multisequence MR imaging of the lumbar spine was performed. No intravenous contrast was administered.  COMPARISON:  Lumbar radiographs 05/23/2019.  FINDINGS: Segmentation:  Normal on the comparison radiographs.  Alignment: Grade 1  anterolisthesis of L3 on L4 appears stable measuring 7 millimeters. Subtle retrolisthesis at L4-L5 and L5-S1. Mild straightening of lower lumbar lordosis.  Vertebrae: Chronic bilateral L3 pars fractures suspected. No marrow edema or evidence of acute osseous abnormality. Visualized bone marrow signal is within normal limits. Intact visible sacrum and SI joints.  Conus medullaris and cauda equina: Conus extends to the L1 level. No lower spinal cord or conus signal abnormality.  Paraspinal and other soft tissues: Negative; probable left ovary which is partially visible anterior to the psoas muscle on series 7, image 40 and sagittal series 6, image 15.  Disc levels:  T11-T12: Chronic disc desiccation and anterior disc bulge with endplate spurring. No stenosis.  T12-L1:  Negative.  L1-L2:  Negative.  L2-L3:  Negative disc. Mild facet hypertrophy. No stenosis.  L3-L4: Disc desiccation and disc space loss with circumferential disc/pseudo disc. Broad-based posterior component. Mild to moderate facet hypertrophy. No spinal or lateral recess stenosis but moderate bilateral L3 foraminal stenosis.  L4-L5: Disc desiccation and mild disc space loss. Mild circumferential disc bulge with superimposed small right paracentral disc protrusion (series 7, image 29), annular fissure. Mild posterior element hypertrophy. Borderline to mild right lateral recess stenosis without spinal stenosis (right L5 nerve level). No foraminal stenosis.  L5-S1: Circumferential disc bulge with mild endplate spurring. Superimposed small to moderate size disc extrusion into the left lateral recess (series 4, image 8 and series 7, image 35). Moderate left lateral recess stenosis (left S1 nerve level). No significant spinal  stenosis. Mild left greater than right L5 foraminal stenosis.  IMPRESSION: 1. Symptomatic level with regard to the left leg appears to be L5-S1 where there is a small-to-moderate  disc extrusion into the left lateral recess. Query Left S1 radiculitis. 2. L3-L4 spondylolisthesis with chronic pars fractures, disc and posterior element degeneration. Subsequent moderate bilateral L3 neural foraminal stenosis. 3. Small right paracentral disc herniation at L4-L5 mildly affecting the right lateral recess.   Electronically Signed   By: Odessa Fleming M.D.   On: 06/01/2019 22:18  I, [name], personally (independently) visualized and performed the interpretation of the images attached in this note.     Assessment and Plan: 50 y.o. female with low back pain and left posterior leg pain consistent with S1 dermatomal pattern. Although patient does have a history of spondylolisthesis and bilateral pars defects at L3-L4 her main issue seems to be left sciatica.  She does have some nerve root compression on MRI November 2020.  However she has failed to improve with epidural steroid injections including left S1 transforaminal injection December 2020 and same on March 2021.  She notes these injections only helped for about a week.  We discussed her options.  She is already scheduled a independent second opinion with a orthopedic spine surgeon and Deanna Sharper (Dr [name]) on Dec 9th.  I think this is a reasonable plan.  However we will update MRI.  Her last MRI was over a year ago and her symptoms have worsened.  Certainly could have some change over the last year.  Consider repeat trial of epidural steroid injection or surgical consultation or more physical therapy.   PDMP not reviewed this encounter. Orders Placed This Encounter  Procedures  . MR Lumbar Spine Wo Contrast    Standing Status:   Future    Standing Expiration Date:   06/08/2021    Order Specific Question:   What is the patient's sedation requirement?    Answer:   No Sedation    Order Specific Question:   Does the patient have a pacemaker or implanted devices?    Answer:   No    Order Specific Question:    Preferred imaging location?    Answer:   [facility] (table limit-350lbs)   No orders of the defined types were placed in this encounter.    Discussed warning signs or symptoms. Please see discharge instructions. Patient expresses understanding.   The above documentation has been reviewed and is accurate and complete [name], M.D.    Total encounter time 30 minutes including face-to-face time with the patient and, reviewing past medical record, and charting on the date of service.   Treatment plan and options

## 2020-06-08 ENCOUNTER — Ambulatory Visit (INDEPENDENT_AMBULATORY_CARE_PROVIDER_SITE_OTHER): Payer: Managed Care, Other (non HMO) | Admitting: Family Medicine

## 2020-06-08 ENCOUNTER — Encounter: Payer: Self-pay | Admitting: Family Medicine

## 2020-06-08 ENCOUNTER — Other Ambulatory Visit: Payer: Self-pay

## 2020-06-08 DIAGNOSIS — M4316 Spondylolisthesis, lumbar region: Secondary | ICD-10-CM | POA: Diagnosis not present

## 2020-06-08 NOTE — Patient Instructions (Signed)
Thank you for coming in today.  Plan for repeat MRI.  Plan for second opinion with Harlan B. Daubert, MD.  Make sure you print and take with you the notes and MIR/Xray reports.   Make sure he sends me his report.   [facility] at [phone] to both schedule the MRI and get a CD made of your old MRI.    We could do another injection possibly at the L3-L4 level or get a nerve conduction study.   Let me know.

## 2020-06-14 ENCOUNTER — Other Ambulatory Visit: Payer: Self-pay

## 2020-06-14 ENCOUNTER — Ambulatory Visit (INDEPENDENT_AMBULATORY_CARE_PROVIDER_SITE_OTHER): Payer: Managed Care, Other (non HMO)

## 2020-06-14 DIAGNOSIS — M48061 Spinal stenosis, lumbar region without neurogenic claudication: Secondary | ICD-10-CM | POA: Diagnosis not present

## 2020-06-14 DIAGNOSIS — M4316 Spondylolisthesis, lumbar region: Secondary | ICD-10-CM

## 2020-06-14 DIAGNOSIS — M5126 Other intervertebral disc displacement, lumbar region: Secondary | ICD-10-CM | POA: Diagnosis not present

## 2020-06-14 IMAGING — MR MR LUMBAR SPINE W/O CM
4 of 5 series · 25 of 48 positions shown · non-contrast
Comparison: Plain films lumbar spine [DATE]. MRI lumbar spine
[DATE].

CLINICAL DATA: Chronic low back and left leg pain. No known injury.

EXAM:
MRI LUMBAR SPINE WITHOUT CONTRAST
TECHNIQUE: Multiplanar, multisequence MR imaging of the lumbar spine was
performed. No intravenous contrast was administered.

[Series 2: T2 · sagittal · 4.0mm · 0.81mm/px · 6 of 15 slices shown (1 of 2)]
[im 1/15]
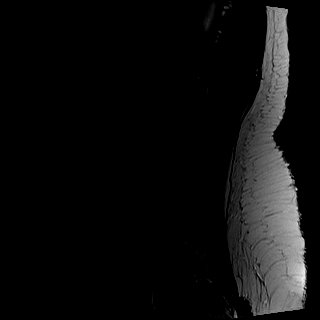
[im 3/15]
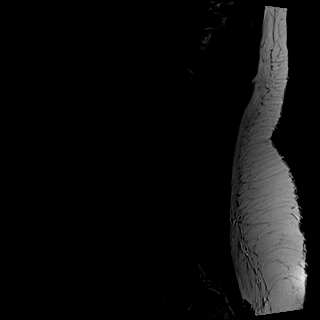
[im 6/15]
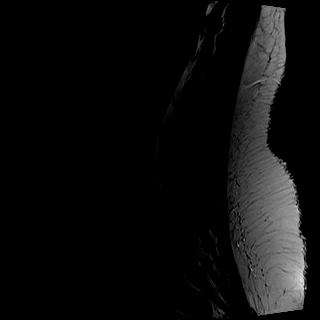
[im 9/15]
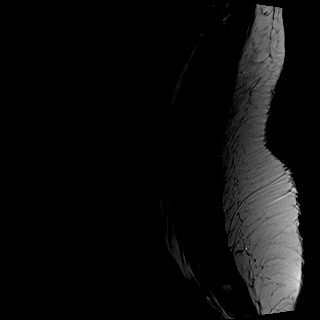
[im 12/15]
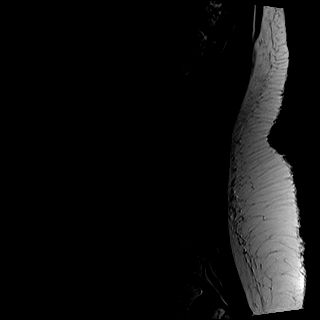
[im 15/15]
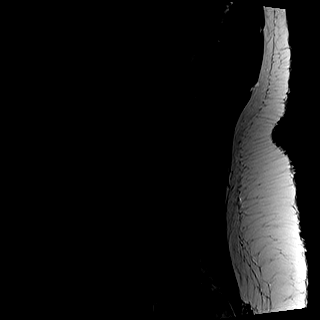

[Series 3: T1 · sagittal · 4.0mm · 0.41mm/px · 6 of 15 slices shown (1 of 2)]
[im 1/15]
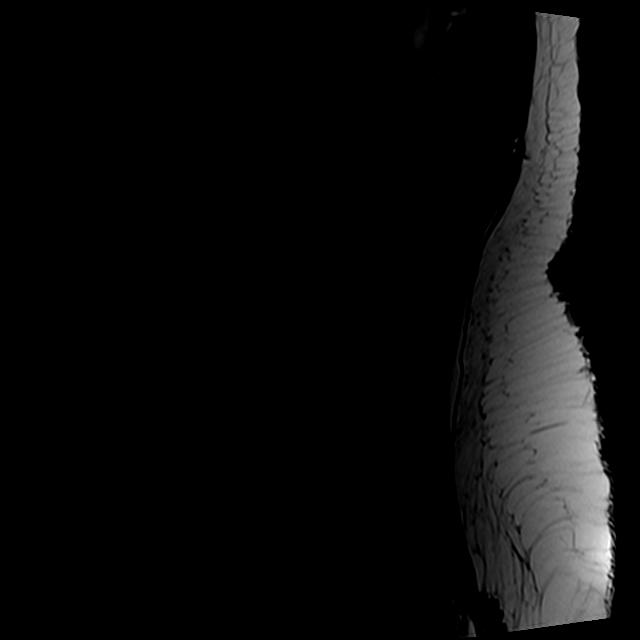
[im 3/15]
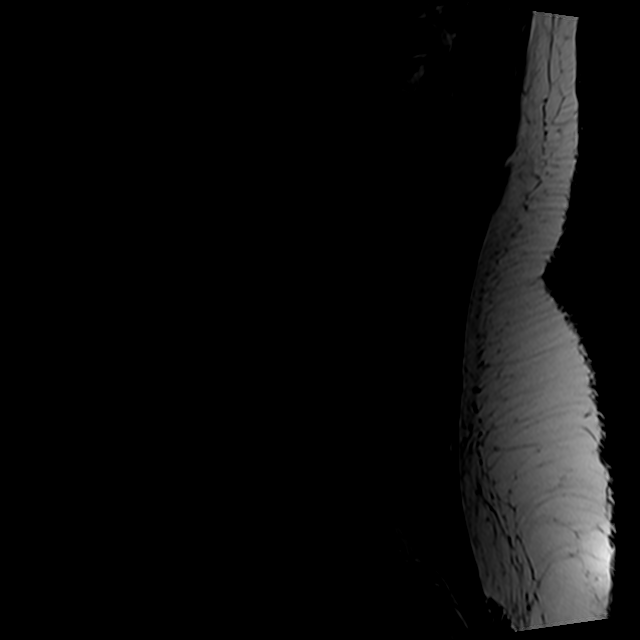
[im 6/15]
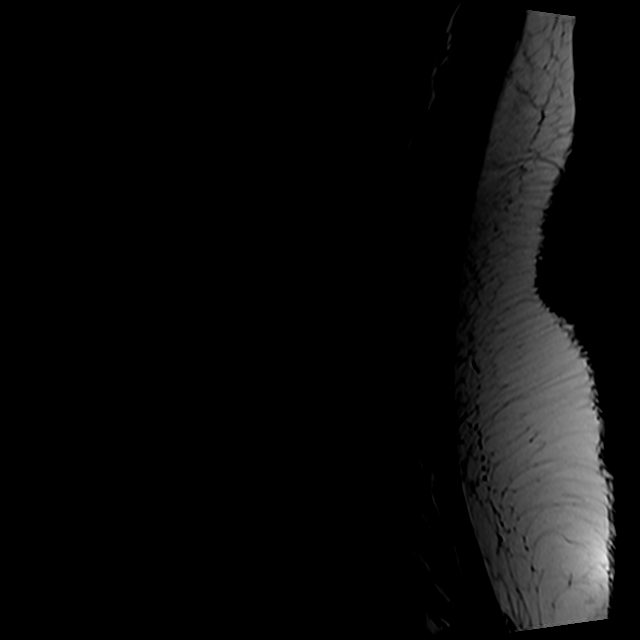
[im 9/15]
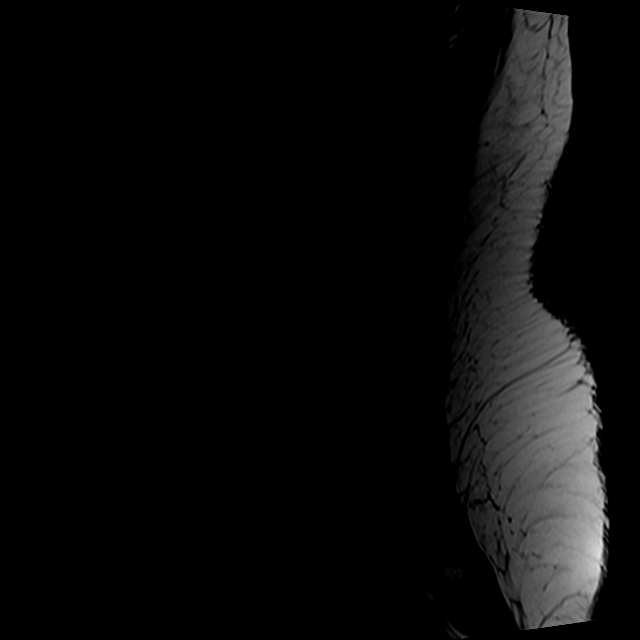
[im 12/15]
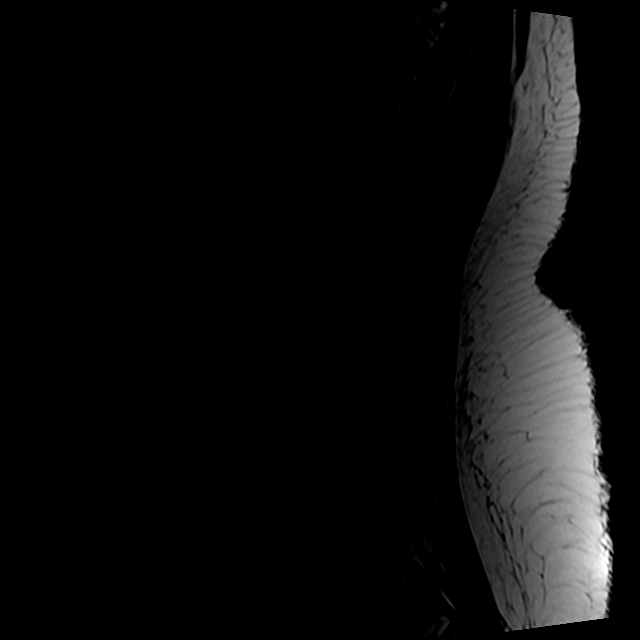
[im 15/15]
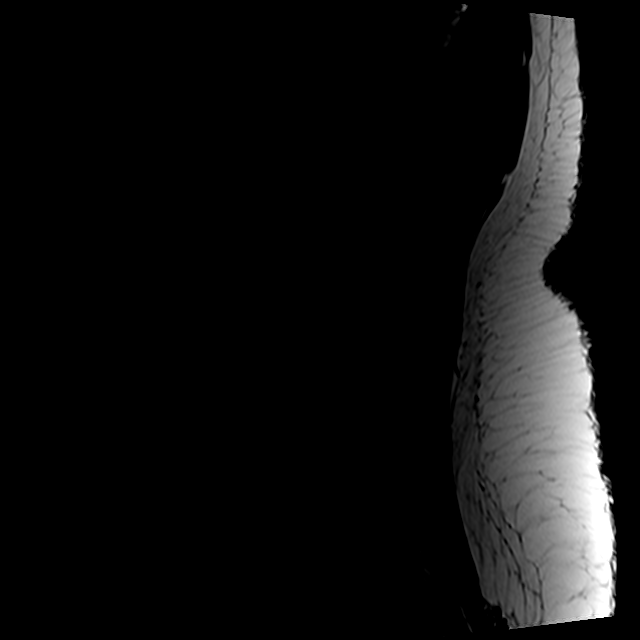

[Series 5: T2 · axial · 4.0mm · 0.78mm/px · z∈[-152,+63]mm · 9 of 40 slices shown (2 of 2)]
[im 1/40]
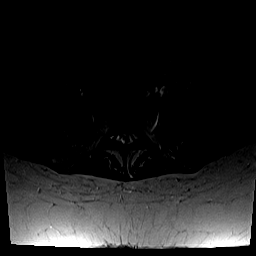
[im 6/40]
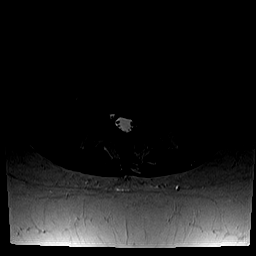
[im 12/40]
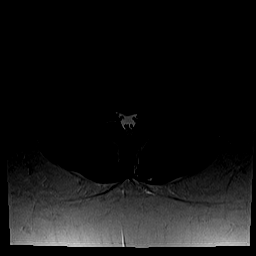
[im 17/40]
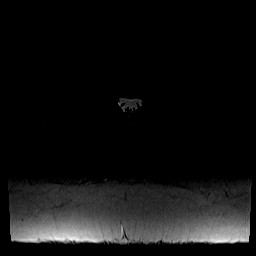
[im 20/40]
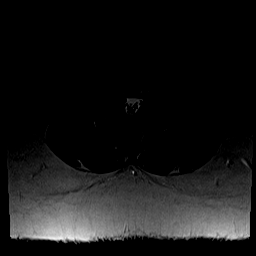
[im 23/40]
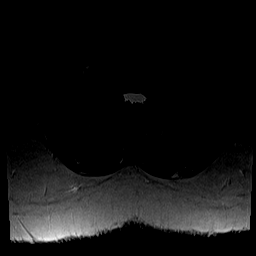
[im 28/40]
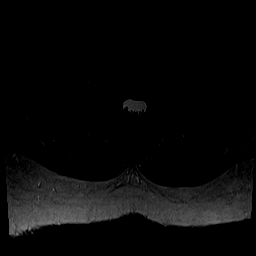
[im 34/40]
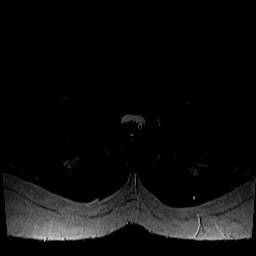
[im 40/40]
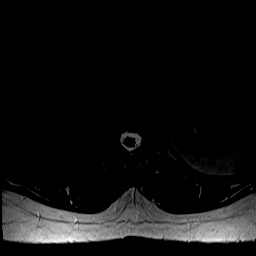

[Series 6: T1 · axial · 4.0mm · 0.39mm/px · z∈[-152,+33]mm · 4 of 40 slices shown (2 of 2)]
[im 1/40]
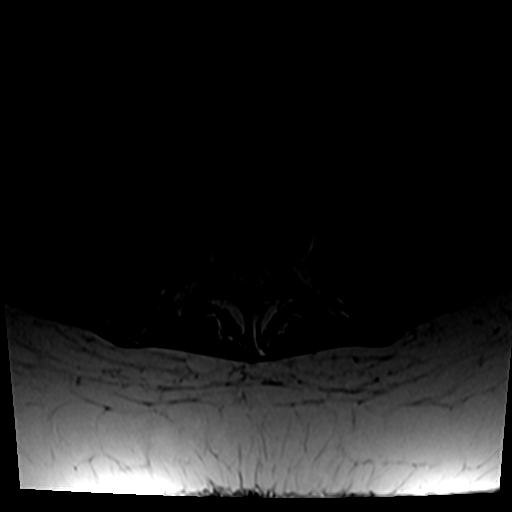
[im 6/40]
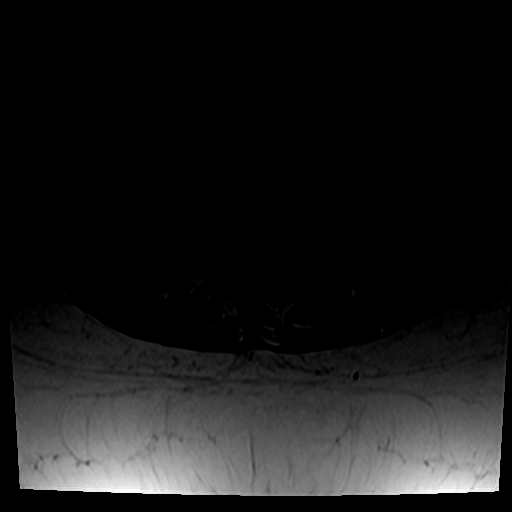
[im 20/40]
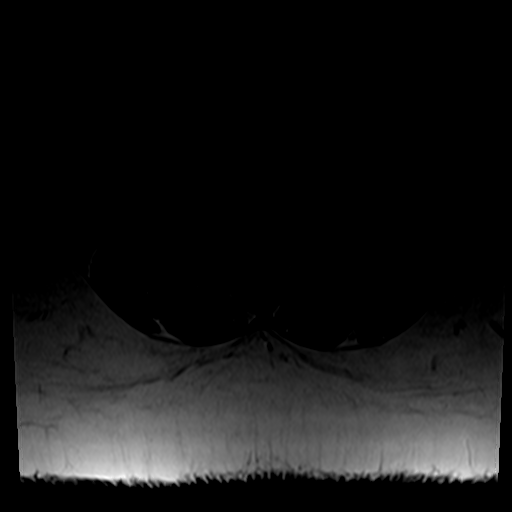
[im 34/40]
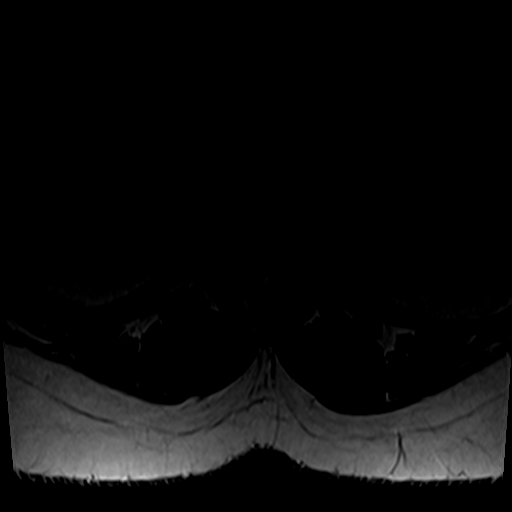

[25 of 48 positions shown; findings below may reference images not displayed]

FINDINGS: Segmentation:  Standard.

Alignment: 0.6 cm anterolisthesis L3 on L4 due to bilateral L3 pars
interarticularis defects. Trace retrolisthesis L4 on L5 and L5 on
S1. Alignment is unchanged.

Vertebrae:  No fracture, evidence of discitis, or bone lesion.

Conus medullaris and cauda equina: Conus extends to the L2 level.
Conus and cauda equina appear normal.

Paraspinal and other soft tissues: Negative.

Disc levels:

T10-11 and T11-12 are imaged in the sagittal plane only. There is
minimal disc bulging at these levels without stenosis. No change.

T12-L1: Negative.

L1-2: Negative.

L2-3: Negative.

L3-4: Mild disc uncovering. Shallow central protrusion with cephalad
extension and an annular fissure. There is mild central canal
stenosis. Mild to moderate foraminal narrowing is worse on the
right. No marked change.

L4-5: Right paracentral protrusion indenting the ventral thecal sac
and causing minimal narrowing in the right lateral recess is
unchanged. Foramina are open.

L5-S1: Left subarticular recess disc protrusion has increased in
size. The disc impinges on the descending left S1 root. The foramina
are open.
IMPRESSION: Since the prior MRI, a left subarticular recess protrusion impinging
on the left S1 root at L5-S1 has increased in size.

Chronic bilateral L3 pars interarticularis defects result in 0.6 cm
anterolisthesis L3 on L4. The central canal is open at L3-4 but
there is mild to moderate foraminal narrowing, worse on the right.
No change.

Shallow right paracentral protrusion at L4-5 causing mild narrowing
in the right lateral recess, unchanged.

## 2020-06-14 NOTE — Progress Notes (Signed)
MRI shows a worse bulging disc pressing on the left S1 nerve root.  That is probably causing the majority of her leg pain.  Additionally you have the spondylolisthesis that we discussed at L3-L4 that is probably not causing your leg pain but may be causing some back pain.  Additionally there is a small bulging disc at L4-L5.  Recommend discussing these results in clinic for further details.  Could wait until after your follow-up appointment with orthopedics spine surgery scheduled for December if you would like.

## 2020-08-25 NOTE — Progress Notes (Signed)
I, Philbert Riser, LAT, ATC acting as a scribe for [name], MD.  Mili Piltz is a 50 y.o. female who presents to [facility] at [facility] today for f/u of chronic L knee pain since Oct/Nov 2020. Pt was last seen by Dr. [name] on 06/08/20 for LBP and was referred to an orthopedic surgeon for a second opinion. Pt was last seen by referral, Dr. [name], on 08/05/20 and they discuss possible L3-4 TLIF and L5 decompression. Today, pt reports L knee pain is intolerable. She locates her knee pain to posterior aspect of L knee. Pt reports she is still having radiating pain down her L leg due to her back pathology.  Her posterior knee pain and leg pain did improve with epidural steroid injections.  Knee swelling: no Knee mechanical symptoms: no Aggravating factors: sitting (having knee flexed) Treatments tried: naproxen, gabapentin,   Dx imaging: 11/27/19 L knee XR  Pertinent review of systems: No fevers or chills  Relevant historical information: L3 pars defects   Exam:  BP (!) 127/92 (BP Location: Right Arm, Patient Position: Sitting, Cuff Size: Normal)   Pulse 95   Ht 5' 4.5" (1.638 m)   Wt 230 lb 3.2 oz (104.4 kg)   SpO2 98%   BMI 38.90 kg/m  General: Well Developed, well nourished, and in no acute distress.   MSK: Left knee normal-appearing Normal motion. Nontender. Stable ligamentous exam. Intact strength. Mildly positive McMurray's test medially.    Lab and Radiology Results  Procedure: Real-time Ultrasound Guided Injection of left knee superior lateral patellar space Device: Philips Affiniti 50G Images permanently stored and available for review in PACS Verbal informed consent obtained.  Discussed risks and benefits of procedure. Warned about infection bleeding damage to structures skin hypopigmentation and fat atrophy among others. Patient expresses understanding and agreement Time-out conducted.   Noted no overlying erythema, induration, or  other signs of local infection.   Skin prepped in a sterile fashion.   Local anesthesia: Topical Ethyl chloride.   With sterile technique and under real time ultrasound guidance:  40 mg of Kenalog and 2 mL of Marcaine injected into joint capsule. Fluid seen entering the joint.   Completed without difficulty   Pain moderately resolved suggesting accurate placement of the medication.   Advised to call if fevers/chills, erythema, induration, drainage, or persistent bleeding.   Images permanently stored and available for review in the ultrasound unit.  Impression: Technically successful ultrasound guided injection.   EXAM: LEFT KNEE 3 VIEWS  COMPARISON:  None.  FINDINGS: Standing frontal, lateral, and sunrise patellar images were obtained. No fracture, dislocation, or joint effusion. Joint spaces appear normal. No erosive change.  IMPRESSION: No fracture, dislocation, or joint effusion. No evident arthropathy.   Electronically Signed   By: Bretta Bang III M.D.   On: 11/28/2019 10:12  I, [name], personally (independently) visualized and performed the interpretation of the images attached in this note.        Assessment and Plan: 50 y.o. female with left knee pain thought to be either intra-articular cause or possibly related to S1 or lumbar radiculopathy.  Plan to further evaluate cause of pain today with diagnostic and therapeutic injection as well as proceed with MRI.  Patient had steroid injection which provided some but not complete pain relief.  Additionally will proceed with MRI.  If patient does not have significant pathology in her knee on MRI and does not receive significant benefit from steroid injection would recommend  proceeding with lumbar spine fusion as recommended by her neurosurgeon.  Recheck following MRI knee.   PDMP not reviewed this encounter. Orders Placed This Encounter  Procedures  . [place] LIMITED JOINT SPACE STRUCTURES LOW LEFT(NO LINKED  CHARGES)    Standing Status:   Future    Number of Occurrences:   1    Standing Expiration Date:   02/23/2021    Order Specific Question:   Reason for Exam (SYMPTOM  OR DIAGNOSIS REQUIRED)    Answer:   chronic left knee pain    Order Specific Question:   Preferred imaging location?    Answer:   [facility]  . MR Knee Left  Wo Contrast    Standing Status:   Future    Standing Expiration Date:   08/26/2021    Order Specific Question:   What is the patient's sedation requirement?    Answer:   No Sedation    Order Specific Question:   Does the patient have a pacemaker or implanted devices?    Answer:   No    Order Specific Question:   Preferred imaging location?    Answer:   [facility] (table limit-350lbs)   Meds ordered this encounter  Medications  . predniSONE (DELTASONE) 50 MG tablet    Sig: Take 1 tablet (50 mg total) by mouth daily.    Dispense:  5 tablet    Refill:  0     Discussed warning signs or symptoms. Please see discharge instructions. Patient expresses understanding.   The above documentation has been reviewed and is accurate and complete [name], M.D.

## 2020-08-26 ENCOUNTER — Other Ambulatory Visit: Payer: Self-pay

## 2020-08-26 ENCOUNTER — Ambulatory Visit: Payer: Self-pay

## 2020-08-26 ENCOUNTER — Ambulatory Visit (INDEPENDENT_AMBULATORY_CARE_PROVIDER_SITE_OTHER): Payer: Managed Care, Other (non HMO) | Admitting: Family Medicine

## 2020-08-26 VITALS — BP 127/92 | HR 95 | Ht 64.5 in | Wt 230.2 lb

## 2020-08-26 DIAGNOSIS — M4316 Spondylolisthesis, lumbar region: Secondary | ICD-10-CM | POA: Diagnosis not present

## 2020-08-26 DIAGNOSIS — M25562 Pain in left knee: Secondary | ICD-10-CM

## 2020-08-26 DIAGNOSIS — M5432 Sciatica, left side: Secondary | ICD-10-CM | POA: Diagnosis not present

## 2020-08-26 MED ORDER — PREDNISONE 50 MG PO TABS: 50.0000 mg | ORAL_TABLET | Freq: Every day | ORAL | 0 refills | Status: AC

## 2020-08-26 NOTE — Patient Instructions (Signed)
Thank you for coming in today.   Call or go to the ER if you develop a large red swollen joint with extreme pain or oozing puss.   See how it feels a few hours today while numbed up.   You should hear from MRI scheduling within 1 week. If you do not hear please let me know.    Recheck following MRI.

## 2020-09-22 ENCOUNTER — Telehealth: Payer: Self-pay | Admitting: Family Medicine

## 2020-09-22 NOTE — Telephone Encounter (Signed)
Pt states we ordered an MRI for her in February but she never heard anything about it. She called her insurance today and was advised that it was denied on Feb 18 and a Peer to Peer is needed. Pt requesting status of this request.

## 2020-09-23 NOTE — Telephone Encounter (Signed)
Do you still have the denial for this so I can set up a peer to peer

## 2020-09-29 ENCOUNTER — Telehealth: Payer: Self-pay | Admitting: Family Medicine

## 2020-09-29 NOTE — Telephone Encounter (Signed)
Thank you. I have updated the referral with approval and have contacted helen to inform that she is now approved

## 2020-09-29 NOTE — Telephone Encounter (Signed)
Peer to peer for MRI. MRI approved.  Authorization number [id number].  Will notify MRI for scheduling.

## 2020-10-03 ENCOUNTER — Ambulatory Visit (INDEPENDENT_AMBULATORY_CARE_PROVIDER_SITE_OTHER): Payer: Managed Care, Other (non HMO)

## 2020-10-03 ENCOUNTER — Other Ambulatory Visit: Payer: Self-pay

## 2020-10-03 DIAGNOSIS — M25562 Pain in left knee: Secondary | ICD-10-CM

## 2020-10-03 IMAGING — MR MR KNEE*L* W/O CM
7 series · 40 of 40 positions shown · non-contrast
Comparison: X-ray [DATE]

CLINICAL DATA: Posterior left knee pain for 1 year

EXAM:
MRI OF THE LEFT KNEE WITHOUT CONTRAST
TECHNIQUE: Multiplanar, multisequence MR imaging of the knee was performed. No
intravenous contrast was administered.

[Series 3: T2 fat-sat · axial · 4.0mm · 0.53mm/px · z∈[-74,+71]mm · 5 of 30 slices shown (1 of 3)]
[im 1/30]
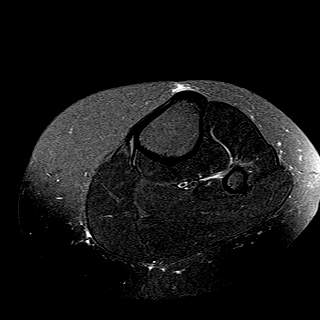
[im 8/30]
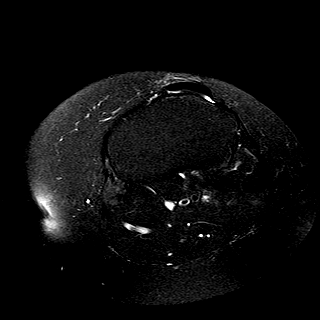
[im 15/30]
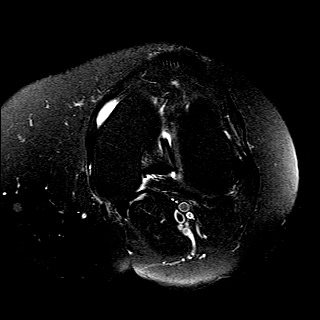
[im 22/30]
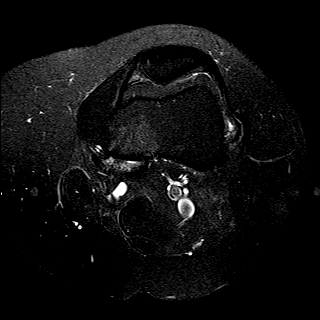
[im 30/30]
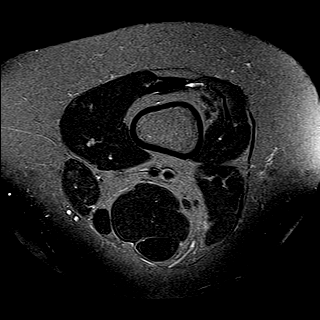

[Series 4: T1 · coronal · 4.0mm · 0.62mm/px · 5 of 28 slices shown]
[im 1/28]
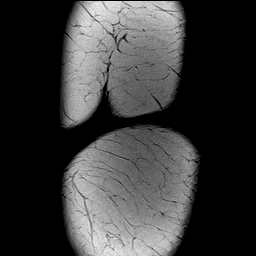
[im 7/28]
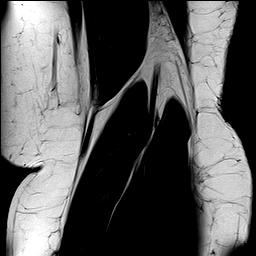
[im 14/28]
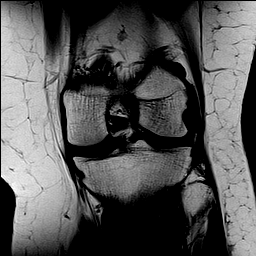
[im 21/28]
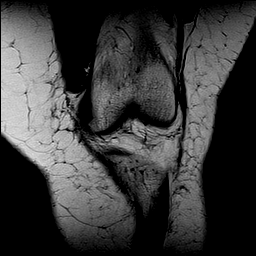
[im 28/28]
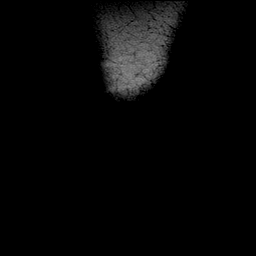

[Series 5: T2 fat-sat · coronal · 4.0mm · 0.62mm/px · 6 of 28 slices shown (2 of 3)]
[im 1/28]
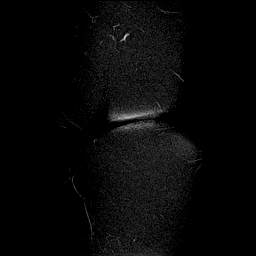
[im 6/28]
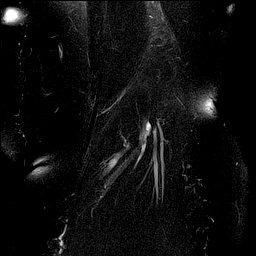
[im 11/28]
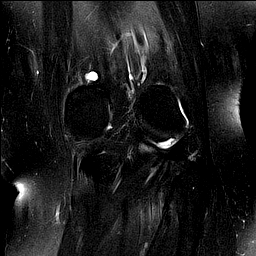
[im 17/28]
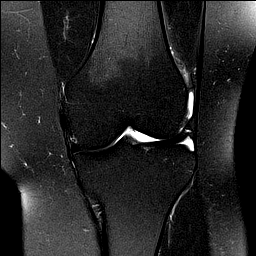
[im 22/28]
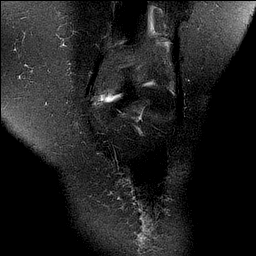
[im 28/28]
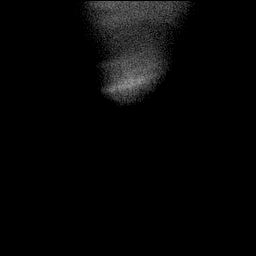

[Series 6: PD fat-sat · coronal · 4.0mm · 0.62mm/px · 6 of 28 slices shown (1 of 3)]
[im 1/28]
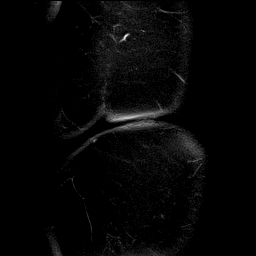
[im 6/28]
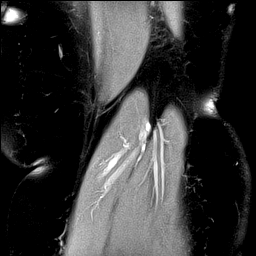
[im 11/28]
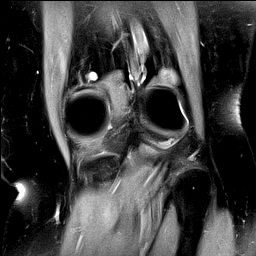
[im 17/28]
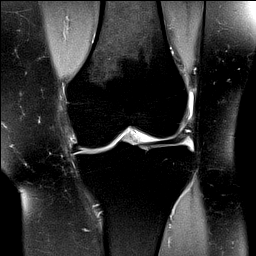
[im 22/28]
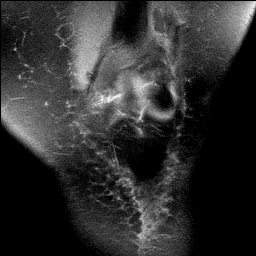
[im 28/28]
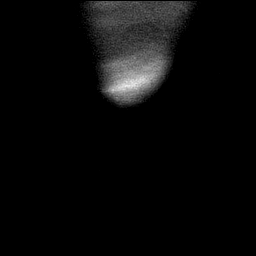

[Series 7: PD fat-sat · sagittal · 3.0mm · 0.62mm/px · 7 of 35 slices shown (2 of 3)]
[im 1/35]
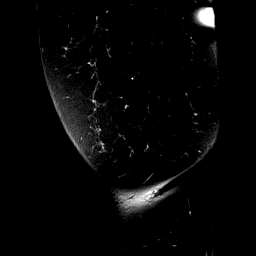
[im 6/35]
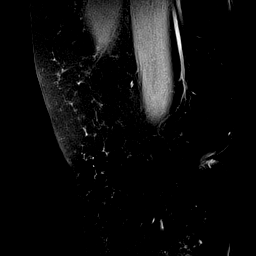
[im 12/35]
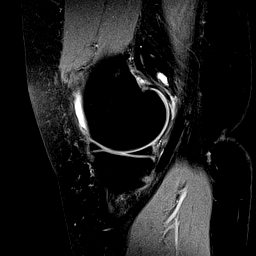
[im 18/35]
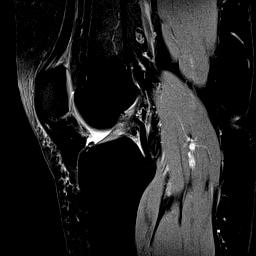
[im 23/35]
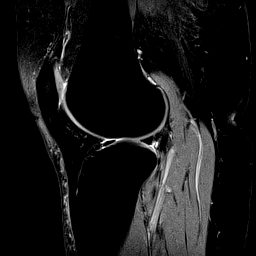
[im 29/35]
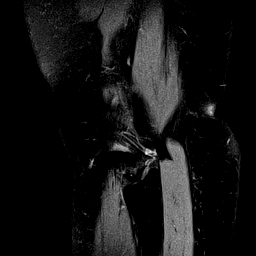
[im 35/35]
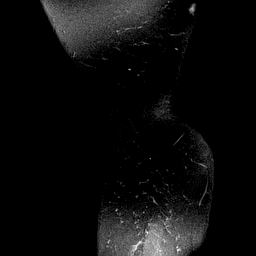

[Series 8: T2 fat-sat · sagittal · 3.0mm · 0.62mm/px · 7 of 35 slices shown (3 of 3)]
[im 1/35]
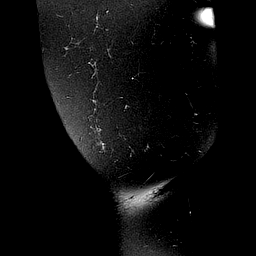
[im 6/35]
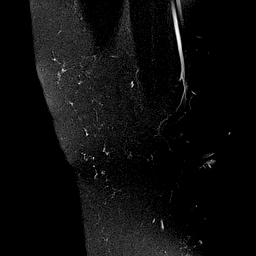
[im 12/35]
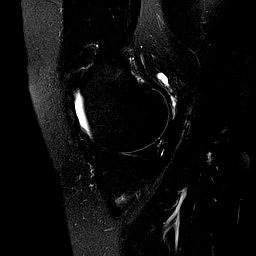
[im 18/35]
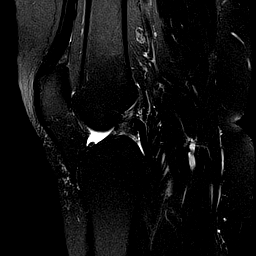
[im 23/35]
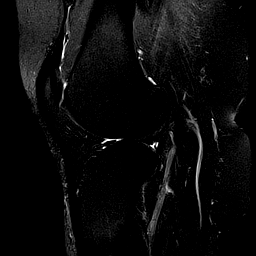
[im 29/35]
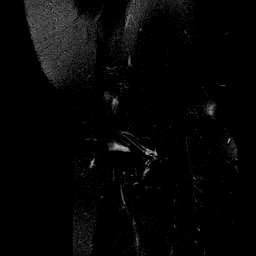
[im 35/35]
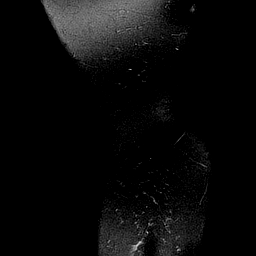

[Series 9: PD fat-sat · oblique · 2.0mm · 0.62mm/px · 4 of 19 slices shown (3 of 3)]
[im 1/19]
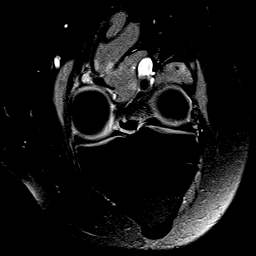
[im 7/19]
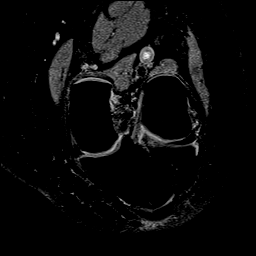
[im 13/19]
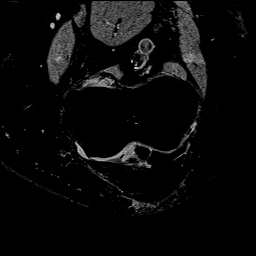
[im 19/19]
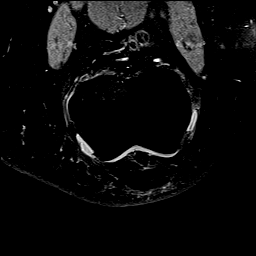

[40 of 40 positions shown; findings below may reference images not displayed]

FINDINGS: MENISCI

Medial meniscus:  Intact.

Lateral meniscus:  Intact.

LIGAMENTS

Cruciates:  Intact ACL and PCL.

Collaterals: Medial collateral ligament is intact. Lateral
collateral ligament complex is intact.

CARTILAGE

Patellofemoral:  No chondral defect.

Medial:  No chondral defect.

Lateral:  No chondral defect.

Joint: Physiologic amount of joint fluid without effusion. Fat pads
within normal limits.

Popliteal Fossa: No Baker's cyst. Elongated multilobulated cystic
structure at the posteromedial aspect of the knee located along the
superficial aspect of the medial gastrocnemius tendon origin
measuring 2.8 x 0.3 x 1.0 cm, likely representing a ganglion cyst.
Intact popliteus tendon.

Extensor Mechanism: Intact quadriceps tendon and patellar tendon.
Mild distal quadriceps tendinosis.

Bones: No focal marrow signal abnormality. No fracture or
dislocation.

Other: Unremarkable muscle bulk and signal intensity. No soft tissue
edema or fluid collections.
IMPRESSION: 1. No evidence of internal derangement of the left knee.
2. Mild distal quadriceps tendinosis.
3. Elongated multilobulated cystic structure at the posteromedial
aspect of the knee located along the superficial aspect of the
medial gastrocnemius tendon origin measuring 2.8 x 0.3 x 1.0 cm,
likely representing a ganglion cyst.

## 2020-10-04 NOTE — Progress Notes (Signed)
MRI right knee shows some mild quadriceps tendinitis.  The inside of the knee looks okay however with no significant arthritis or tendon tears.  There is a elongated ganglion cyst associated with the medial head of the gastrocnemius calf muscle at the back of the knee.  Please schedule follow-up appoint with me to go over this result of full detail and to potentially try to drain that ganglion cyst.

## 2020-10-05 NOTE — Progress Notes (Unsigned)
I, Philbert Riser, LAT, ATC acting as a scribe for [name], MD.  Deanna Sellers is a 50 y.o. female who presents to [facility] at [facility] today for f/u chronic L knee pain ongoing since Oct/Nov 2020 and MRI review. Pt was last seen by Dr. [name] on 08/26/20 and was given L knee steroid injection and was advised to proceed w/ MRI, however pt's insurance denied the ordered and required a peer-to-peer for approval. Today, pt reports slight improvement in knee pain, w/ less "sharp" and radiating pains. Pt notes the prior steroid injection was not helpful. Her pain now is around 4-5/10 and is a dull pain.  Dx imaging: 10/03/20 L knee MRI  06/14/20 L-spine MRI  11/27/19 L knee XR  Pertinent review of systems: No fevers or chills  Relevant historical information: Hypothyroidism   Exam:  BP (!) 125/93 (BP Location: Right Arm, Patient Position: Sitting, Cuff Size: Normal)   Pulse 85   Ht 5' 4.5" (1.638 m)   Wt 228 lb 6.4 oz (103.6 kg)   SpO2 100%   BMI 38.60 kg/m  General: Well Developed, well nourished, and in no acute distress.   MSK: Left knee normal motion.  Not particularly tender to palpation.    Lab and Radiology Results No results found for this or any previous visit (from the past 72 hour(s)). MR Knee Left  Wo Contrast  Result Date: 10/04/2020 CLINICAL DATA:  Posterior left knee pain for 1 year EXAM: MRI OF THE LEFT KNEE WITHOUT CONTRAST TECHNIQUE: Multiplanar, multisequence MR imaging of the knee was performed. No intravenous contrast was administered. COMPARISON:  X-ray 11/27/2019 FINDINGS: MENISCI Medial meniscus:  Intact. Lateral meniscus:  Intact. LIGAMENTS Cruciates:  Intact ACL and PCL. Collaterals: Medial collateral ligament is intact. Lateral collateral ligament complex is intact. CARTILAGE Patellofemoral:  No chondral defect. Medial:  No chondral defect. Lateral:  No chondral defect. Joint: Physiologic amount of joint fluid without effusion. Fat pads  within normal limits. Popliteal Fossa: No Baker's cyst. Elongated multilobulated cystic structure at the posteromedial aspect of the knee located along the superficial aspect of the medial gastrocnemius tendon origin measuring 2.8 x 0.3 x 1.0 cm, likely representing a ganglion cyst. Intact popliteus tendon. Extensor Mechanism: Intact quadriceps tendon and patellar tendon. Mild distal quadriceps tendinosis. Bones: No focal marrow signal abnormality. No fracture or dislocation. Other: Unremarkable muscle bulk and signal intensity. No soft tissue edema or fluid collections. IMPRESSION: 1. No evidence of internal derangement of the left knee. 2. Mild distal quadriceps tendinosis. 3. Elongated multilobulated cystic structure at the posteromedial aspect of the knee located along the superficial aspect of the medial gastrocnemius tendon origin measuring 2.8 x 0.3 x 1.0 cm, likely representing a ganglion cyst. Electronically Signed   By: Duanne Guess D.O.   On: 10/04/2020 11:01   I, [name], personally (independently) visualized and performed the interpretation of the images attached in this note.   Procedure: Real-time Ultrasound Guided aspiration and injection of left knee posterior medial ganglion cyst gastrocnemius tendon Device: Philips Affiniti 50G Images permanently stored and available for review in PACS Verbal informed consent obtained.  Discussed risks and benefits of procedure. Warned about infection bleeding damage to structures skin hypopigmentation and fat atrophy among others. Patient expresses understanding and agreement Time-out conducted.   Noted no overlying erythema, induration, or other signs of local infection.   Skin prepped in a sterile fashion.   Local anesthesia: Topical Ethyl chloride.   With sterile technique and  under real time ultrasound guidance:  3 mL of lidocaine injected into subcutaneous soft tissue and into ganglion cyst along planned aspiration tract. Skin was again  sterilized and an 18-gauge needle was used to access the ganglion cyst under ultrasound guidance.  No fluid was able to be aspirated.  The needle was repositioned a total of 3 times under ultrasound guidance confirmed to be within the ganglion cyst but no fluid was aspirated. The cyst needle was kept in position and the syringe was exchanged and 2 mL of Marcaine and 40 mg of Kenalog was injected into the ganglion cyst. . Fluid seen entering the ganglion cyst.   Completed without difficulty   Pain immediately resolved suggesting accurate placement of the medication.   Advised to call if fevers/chills, erythema, induration, drainage, or persistent bleeding.   Images permanently stored and available for review in the ultrasound unit.  Impression: Technically successful ultrasound guided injection.       Assessment and Plan: 50 y.o. female with left posterior knee ganglion cyst associated with medial gastrocnemius tendon.  This is barely visible on ultrasound as the cyst is long and thin. Attempted aspiration of the cyst on ultrasound today.  No fluid was able to be aspirated but I was able to successfully inject the cystic structure with steroid and Marcaine.  I am hopeful this will resolve the pain.  If she continues to have pain and dysfunction in this area I would suggest next step is surgical referral for second opinion and potential surgical consultation.   PDMP not reviewed this encounter. Orders Placed This Encounter  Procedures  . [place] LIMITED JOINT SPACE STRUCTURES LOW LEFT(NO LINKED CHARGES)    Standing Status:   Future    Number of Occurrences:   1    Standing Expiration Date:   04/08/2021    Order Specific Question:   Reason for Exam (SYMPTOM  OR DIAGNOSIS REQUIRED)    Answer:   chronic left knee pain    Order Specific Question:   Preferred imaging location?    Answer:   [facility]   No orders of the defined types were placed in this  encounter.    Discussed warning signs or symptoms. Please see discharge instructions. Patient expresses understanding.   The above documentation has been reviewed and is accurate and complete [name], M.D.

## 2020-10-06 ENCOUNTER — Ambulatory Visit: Payer: Self-pay

## 2020-10-06 ENCOUNTER — Encounter: Payer: Self-pay | Admitting: Family Medicine

## 2020-10-06 ENCOUNTER — Other Ambulatory Visit: Payer: Self-pay

## 2020-10-06 ENCOUNTER — Ambulatory Visit (INDEPENDENT_AMBULATORY_CARE_PROVIDER_SITE_OTHER): Payer: Managed Care, Other (non HMO) | Admitting: Family Medicine

## 2020-10-06 VITALS — BP 125/93 | HR 85 | Ht 64.5 in | Wt 228.4 lb

## 2020-10-06 DIAGNOSIS — M25562 Pain in left knee: Secondary | ICD-10-CM

## 2020-10-06 DIAGNOSIS — M6740 Ganglion, unspecified site: Secondary | ICD-10-CM | POA: Diagnosis not present

## 2020-10-06 NOTE — Patient Instructions (Signed)
Thank you for coming in today. Call or go to the ER if you develop a large red swollen joint with extreme pain or oozing puss.   Take it easy.  See how it feels while numb.   If it does not feel better it may be worth a second opponens with a Careers adviser.

## 2021-01-12 NOTE — Progress Notes (Signed)
    Deanna Sellers is a 50 y.o. female who presents to [facility] at [facility] today for R knee pain.  She was last seen by Dr. [name] on 10/06/20 for L knee pain and had a L post knee aspiration and injection.  Since then, pt reports R knee pain x 1 month. No injury or known cause.  . She locates her pain to anterior knee. Pain with bending with weight like stairs or squatting.   She denies significant locking or catching.  Pain is worse with activity and better with rest.  No radiating pain distally.   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   MSK: Right knee normal-appearing Normal motion. Tender palpation anterior knee at distal patella tendon. Stable ligamentous exam. Is intact strength however pain is reproduced with resisted knee extension. Negative McMurray's testing.    Lab and Radiology Results  Diagnostic Limited MSK Ultrasound of: Right knee Quad tendon intact normal. Trace joint effusion present superior patellar space. Patellar tendon is intact. Trace hypoechoic fluid tracking superficial and deep to patellar tendon distally. Hyperechoic change distal patellar tendon at insertion consistent with calcific patellar tendinitis. Minimal degenerative changes medial joint line. Normal lateral joint line. No Baker's cyst. Impression: Patellar tendinitis and infrapatellar bursitis possible prepatellar bursitis      Assessment and Plan: 50 y.o. female with right anterior knee pain due to patellar tendinitis and infrapatellar bursitis.  Plan for Voltaren gel and home exercise program and physical therapy.  Recheck in 6 weeks if not improved consider injection or MRI.   PDMP not reviewed this encounter. Orders Placed This Encounter  Procedures   [place] LIMITED JOINT SPACE STRUCTURES LOW RIGHT(NO  LINKED CHARGES)    Order Specific Question:   Reason for Exam (SYMPTOM  OR DIAGNOSIS REQUIRED)    Answer:   rt knee    Order Specific Question:   Preferred imaging location?    Answer:   [facility] referral to Physical Therapy    Referral Priority:   Routine    Referral Type:   Physical Medicine    Referral Reason:   Specialty Services Required    Requested Specialty:   Physical Therapy    Number of Visits Requested:   1   No orders of the defined types were placed in this encounter.    Discussed warning signs or symptoms. Please see discharge instructions. Patient expresses understanding.   The above documentation has been reviewed and is accurate and complete [name], M.D.

## 2021-01-13 ENCOUNTER — Ambulatory Visit (INDEPENDENT_AMBULATORY_CARE_PROVIDER_SITE_OTHER): Payer: Managed Care, Other (non HMO) | Admitting: Family Medicine

## 2021-01-13 ENCOUNTER — Ambulatory Visit: Payer: Self-pay

## 2021-01-13 ENCOUNTER — Other Ambulatory Visit: Payer: Self-pay

## 2021-01-13 ENCOUNTER — Encounter: Payer: Self-pay | Admitting: Family Medicine

## 2021-01-13 DIAGNOSIS — M25561 Pain in right knee: Secondary | ICD-10-CM | POA: Diagnosis not present

## 2021-01-13 DIAGNOSIS — M7651 Patellar tendinitis, right knee: Secondary | ICD-10-CM

## 2021-01-13 NOTE — Patient Instructions (Addendum)
Thank you for coming in today.   Please use Voltaren gel (Generic Diclofenac Gel) up to 4x daily for pain as needed.  This is available over-the-counter as both the name brand Voltaren gel and the generic diclofenac gel.   I've referred you to Physical Therapy.  Let us know if you don't hear from them in one week.   Recheck in 8 weeks especially if not better.   Patellar Tendinitis Rehab Ask your health care provider which exercises are safe for you. Do exercises exactly as told by your health care provider and adjust them as directed. It is normal to feel mild stretching, pulling, tightness, or discomfort as you do these exercises. Stop right away if you feel sudden pain or your pain gets worse. Do not begin these exercises until told by your health care provider. Stretching and range-of-motion exercise This exercise warms up your muscles and joints and improves the movement andflexibility of your knee. The exercise also helps to relieve pain and stiffness. Hamstring, doorway stretch This is an exercise in which you lie in a doorway and prop your leg on a wall to stretch the back of your knee and thigh (hamstring). Lie on your back in front of a doorway with your left / right leg resting against the wall and your other leg flat on the floor in the doorway. There should be a slight bend in your left / right knee. Straighten your left / right knee. You should feel a stretch behind your knee or thigh. If you do not, scoot your buttocks closer to the door. Hold this position for __________ seconds. Repeat __________ times. Complete this exercise __________ times a day. Strengthening exercises These exercises build strength and endurance in your knee. Endurance is theability to use your muscles for a long time, even after they get tired. Quadriceps, isometric This exercise stretches the muscles in front of your thigh (quadriceps) without moving your knee joint (isometric). Lie on your back with  your left / right leg extended and your other knee bent. Slowly tense the muscles in the front of your left / right thigh. When you do this, you should see your kneecap slide up toward your hip or see increased dimpling just above the knee. This motion will push the back of your knee toward the floor. If this is painful, try putting a rolled-up hand towel under your knee to support it in a bent position. Change the size of the towel to find a position that allows you to do this exercise without any pain. For __________ seconds, hold the muscle as tight as you can without increasing your pain. Relax the muscles slowly and completely. Repeat __________ times. Complete this exercise __________ times a day. Straight leg raises This exercise stretches the muscles in front of your thigh (quadriceps) and the muscles that move your hips (hip flexors). Lie on your back with your left / right leg extended and your other knee bent. Tense the muscles in the front of your left / right thigh. When you do this, you should see your kneecap slide up or see increased dimpling just above the knee. Keep these muscles tight as you raise your leg 4-6 inches (10-15 cm) off the floor. Do not let your moving knee bend. Hold this position for __________ seconds. Keep these muscles tense as you slowly lower your leg. Relax your muscles slowly and completely. Repeat __________ times. Complete this exercise __________ times a day. Squats This is a weight-bearing exercise  in which you bend your knees and lower your hips while engaging your thigh muscles. Stand in front of a table, with your feet and knees pointing straight ahead. You may rest your hands on the table for balance but not for support. Slowly bend your knees and lower your hips like you are going to sit in a chair. Keep your weight over your heels, not over your toes. Keep your lower legs upright so they are parallel with the table legs. Do not let your hips go  lower than your knees. Do not bend lower than told by your health care provider. If your knee pain increases, do not bend as low. Hold the squat position for __________ seconds. Slowly push with your legs to return to standing. Do not use your hands to pull yourself to standing. Repeat __________ times. Complete this exercise __________ times a day. Step-downs This is an exercise in which you step down slowly while engaging your leg muscles. Stand on the edge of a step. Keeping your weight over your __________ heel, slowly bend your __________ knee to bring your __________ heel toward the floor. Lower your heel as far as you can while keeping control and without increasing any discomfort. Do not let your __________ knee come forward. Use your leg muscles, not gravity, to lower your body. Hold a wall or rail for balance if needed. Slowly push through your heel to lift your body weight back up. Return to the starting position. Repeat __________ times. Complete this exercise __________ times a day. Straight leg raises This exercise strengthens the muscles that rotate the leg at the hip and move it away from your body (hip abductors). Lie on your side with your left / right leg in the top position. Lie so your head, shoulder, knee, and hip line up. You may bend your lower knee to help you keep your balance. Roll your hips slightly forward, so that your hips are stacked directly over each other and your left / right knee is facing forward. Leading with your heel, lift your top leg 4-6 inches (10-15 cm). You should feel the muscles in your outer hip lifting. Do not let your foot drift forward. Do not let your knee roll toward the ceiling. Hold this position for __________ seconds. Slowly lower your leg to the starting position. Let your muscles relax completely after each repetition. Repeat __________ times. Complete this exercise __________ times a day. This information is not intended to  replace advice given to you by your health care provider. Make sure you discuss any questions you have with your healthcare provider. Document Revised: 10/24/2018 Document Reviewed: 04/23/2018 Elsevier Patient Education  2022 [employer].

## 2021-02-01 ENCOUNTER — Ambulatory Visit: Payer: Managed Care, Other (non HMO)

## 2021-02-07 ENCOUNTER — Ambulatory Visit: Payer: Managed Care, Other (non HMO) | Admitting: Physical Therapy

## 2021-02-24 ENCOUNTER — Ambulatory Visit: Payer: Managed Care, Other (non HMO) | Admitting: Family Medicine

## 2022-12-08 ENCOUNTER — Ambulatory Visit: Payer: Managed Care, Other (non HMO) | Admitting: Family Medicine

## 2022-12-08 NOTE — Progress Notes (Deleted)
   Rubin Payor, PhD, LAT, ATC acting as a scribe for [name], MD.  Noreta Mcnicholas is a 52 y.o. female who presents to [facility] at [facility] today for shoulder pain. Pt was previously seen by Dr. [name] in 2022 for patellar tendinitis.   Today, pt c/o shoulder pain x ***. Pt locates pain to ***  Neck pain: Radiates: Aggravates: Treatments tried:  Pertinent review of systems: ***  Relevant historical information: ***   Exam:  There were no vitals taken for this visit. General: Well Developed, well nourished, and in no acute distress.   MSK: ***    Lab and Radiology Results No results found for this or any previous visit (from the past 72 hour(s)). No results found.     Assessment and Plan: 52 y.o. female with ***   PDMP not reviewed this encounter. No orders of the defined types were placed in this encounter.  No orders of the defined types were placed in this encounter.    Discussed warning signs or symptoms. Please see discharge instructions. Patient expresses understanding.   ***

## 2022-12-14 NOTE — Progress Notes (Signed)
Rubin Payor, PhD, LAT, ATC acting as a scribe for [name], MD.  Deanna Sellers is a 52 y.o. female who presents to [facility] at [facility] today for R shoulder pain. Pt was previously seen by Dr. [name] in 2022 for patellar tendinitis.   Today, pt c/o R shoulder pain x 8 months. No MOI. She is RHD. Pt locates pain to deep within the R shoulder joint. Pain is intermittent.   Neck pain: no Radiates: no Aggravates: putting on backpack, aBd, lifting objection Treatments tried: none  She also c/o L knee pain ongoing 5 months. She slipped getting out of the bathtub and hit her L knee on the corner of the vanity. She feels like there is a "dent" in her L knee and there is "something floating around" within the joint.   L Knee swelling: no Mechanical symptoms: no  Pertinent review of systems: No fevers or chills  Relevant historical information: Back pain with spondylolisthesis.   Exam:  BP 132/88   Pulse 80   Ht 5' 4.5" (1.638 m)   Wt 225 lb (102.1 kg)   SpO2 100%   BMI 38.02 kg/m  General: Well Developed, well nourished, and in no acute distress.   MSK: Right shoulder normal-appearing Normal motion. Intact strength. Mildly positive Hawkins and Neer's test. Negative Yergason's and speeds test.  Negative empty can test. Pulses cap refill and sensation are intact distally.  Left knee: Normal-appearing Palpable mobile subcutaneous structure present overlying the patella.  Otherwise the knee is normal to palpation and nontender. Normal strength. Stable ligamentous exam. Negative McMurray's test.    Radiology:  Diagnostic Limited MSK Ultrasound of: Right shoulder: Biceps tendon normal-appearing Tiny ganglion cyst associated with the biceps tendon in the bicipital groove Subscapularis tendon is normal. Supraspinatus tendon is intact with moderate subacromial bursitis present. Infraspinatus tendon normal. AC joint mild degenerative with a mild  effusion. Impression: Subacromial bursitis   Diagnostic Limited MSK Ultrasound of: Left anterior knee Normal quad tendon and patellar tendon. I am unable to visualize the palpable subcutaneous nodule overlying the patella. Bony cortex normal-appearing patella. Impression: Unable to visualize palpable abnormality  X-ray images right shoulder obtained today personally and independently interpreted Minimal calcifications present at the rotator cuff tendon insertion site superior humeral head.  Mild AC DJD.  No acute fractures are visible. Await formal radiology review   Assessment and Plan: 52 y.o. female with right shoulder pain.  This is a chronic ongoing issue now for several months.  Pain thought to be due to subacromial impingement and bursitis.  She she is a good candidate for PT.  Plan for PT and home exercise program.  Left anterior knee palpable abnormality after a fall.  She likely has a scar or injury to the periosteum overlying the patella.  This is not visible on ultrasound which is typical.  She is not very much bothered by this so we can proceed with watchful waiting for now.   PDMP not reviewed this encounter. Orders Placed This Encounter  Procedures   [place] LIMITED JOINT SPACE STRUCTURES LOW LEFT(NO LINKED CHARGES)    Order Specific Question:   Reason for Exam (SYMPTOM  OR DIAGNOSIS REQUIRED)    Answer:   left knee pain    Order Specific Question:   Preferred imaging location?    Answer:   [facility] Shoulder Right    Standing Status:   Future    Number of Occurrences:  1    Standing Expiration Date:   12/15/2023    Order Specific Question:   Reason for Exam (SYMPTOM  OR DIAGNOSIS REQUIRED)    Answer:   eval shoulder pain    Order Specific Question:   Is patient pregnant?    Answer:   No    Order Specific Question:   Preferred imaging location?    Answer:   [facility]   Ambulatory referral to Physical Therapy    Referral  Priority:   Routine    Referral Type:   Physical Medicine    Referral Reason:   Specialty Services Required    Requested Specialty:   Physical Therapy    Number of Visits Requested:   1   No orders of the defined types were placed in this encounter.    Discussed warning signs or symptoms. Please see discharge instructions. Patient expresses understanding.   The above documentation has been reviewed and is accurate and complete [name], M.D.

## 2022-12-15 ENCOUNTER — Ambulatory Visit (INDEPENDENT_AMBULATORY_CARE_PROVIDER_SITE_OTHER): Payer: BC Managed Care – PPO

## 2022-12-15 ENCOUNTER — Ambulatory Visit (INDEPENDENT_AMBULATORY_CARE_PROVIDER_SITE_OTHER): Payer: BC Managed Care – PPO | Admitting: Family Medicine

## 2022-12-15 ENCOUNTER — Other Ambulatory Visit: Payer: Self-pay

## 2022-12-15 VITALS — BP 132/88 | HR 80 | Ht 64.5 in | Wt 225.0 lb

## 2022-12-15 DIAGNOSIS — M25511 Pain in right shoulder: Secondary | ICD-10-CM

## 2022-12-15 DIAGNOSIS — G8929 Other chronic pain: Secondary | ICD-10-CM

## 2022-12-15 DIAGNOSIS — M25562 Pain in left knee: Secondary | ICD-10-CM

## 2022-12-15 NOTE — Patient Instructions (Signed)
Thank you for coming in today.   I've referred you to Physical Therapy.  Let us know if you don't hear from them in one week.   Please get an Xray today before you leave   If not better in about 8 weeks please recheck.  Please let me know if you have a problem.

## 2022-12-18 NOTE — Progress Notes (Signed)
Right shoulder x-ray shows a little bit of arthritis at the small joint at the top of the shoulder and some irritation as the rotator cuff attaches to the shoulder bone.

## 2023-01-23 ENCOUNTER — Ambulatory Visit: Payer: BC Managed Care – PPO | Attending: Family Medicine | Admitting: Physical Therapy

## 2023-01-23 ENCOUNTER — Encounter: Payer: Self-pay | Admitting: Physical Therapy

## 2023-01-23 DIAGNOSIS — G8929 Other chronic pain: Secondary | ICD-10-CM | POA: Insufficient documentation

## 2023-01-23 DIAGNOSIS — M25511 Pain in right shoulder: Secondary | ICD-10-CM | POA: Diagnosis present

## 2023-01-23 DIAGNOSIS — R293 Abnormal posture: Secondary | ICD-10-CM | POA: Insufficient documentation

## 2023-01-23 NOTE — Therapy (Signed)
OUTPATIENT PHYSICAL THERAPY SHOULDER EVALUATION   Patient Name: Deanna Sellers MRN: [id number] DOB:1971/06/22, 52 y.o., female Today's Date: 01/23/2023  END OF SESSION:  PT End of Session - 01/23/23 1630     Visit Number 1    Date for PT Re-Evaluation 03/20/23    Authorization Type BCBS    PT Start Time 1625    PT Stop Time 1705    PT Time Calculation (min) 40 min    Activity Tolerance Patient tolerated treatment well    Behavior During Therapy WFL for tasks assessed/performed             Past Medical History:  Diagnosis Date   Abnormal Pap smear of cervix    Anemia    Asthma    HLD (hyperlipidemia)    Hypothyroidism    Migraine    Urticaria    Vitamin D deficiency    Past Surgical History:  Procedure Laterality Date   CESAREAN SECTION N/A 1993   TUBAL LIGATION     Patient Active Problem List   Diagnosis Date Noted   Ganglion cyst 10/06/2020   Sciatica, left side 11/27/2019   Lumbar spondylosis 09/18/2019   Spondylolisthesis at L3-L4 level 06/10/2019   Heartburn 12/25/2018   Moderate asthma 11/05/2018   Anemia 05/20/2017   Obesity due to excess calories without serious comorbidity 05/20/2017   Intractable migraine with status migrainosus 02/16/2017   Hypothyroidism, acquired 03/28/2016    PCP: Gillian Scarce, MD   REFERRING PROVIDER: Rodolph Bong, MD  REFERRING DIAG: M25.511 (ICD-10-CM) - Acute pain of right shoulder  THERAPY DIAG:  Chronic right shoulder pain - Plan: PT plan of care cert/re-cert  Abnormal posture - Plan: PT plan of care cert/re-cert  Rationale for Evaluation and Treatment: Rehabilitation  ONSET DATE: ~ 9 months ago (Nov 2023)  SUBJECTIVE:                                                                                                                                                                                      SUBJECTIVE STATEMENT: Patient reports she's had R shoulder pain for about 9 months now, but hadn't complained about  it before now, so nothing has been done for it yet, other than referral to PT.  Pain just started gradually, no injury.  Notices it with lifting any weight and certain movements, like wiping windows, or reaching behind back.  Hand dominance: Right  PERTINENT HISTORY: Anemia, migraine, hypothyroidism, low back pain  PAIN:  Are you having pain? Yes: NPRS scale: 4-5/10 Pain location: R shoulder Pain description: throbbing Aggravating factors: sleeping on R side, wiping windows motion, reaching up with a weight Relieving  factors: haven't tried anything.   PRECAUTIONS: None  WEIGHT BEARING RESTRICTIONS: No  FALLS:  Has patient fallen in last 6 months? No  OCCUPATION: Proofreader - travels a lot  PLOF: Independent  PATIENT GOALS: decrease pain  NEXT MD VISIT: none scheduled   OBJECTIVE:   DIAGNOSTIC FINDINGS:  12/15/22 Diagnostic Limited MSK Ultrasound of: Right shoulder: Biceps tendon normal-appearing  Tiny ganglion cyst associated with the biceps tendon in the bicipital groove Subscapularis tendon is normal. Supraspinatus tendon is intact with moderate subacromial bursitis present. Infraspinatus tendon normal. AC joint mild degenerative with a mild effusion. Impression: Subacromial bursitis  12/15/22 R shoulder DG IMPRESSION: Mild AC joint degenerative change. Mild irregularity at the rotator cuff insertion site.   PATIENT SURVEYS:  Quick Dash 25% impairment  COGNITION: Overall cognitive status: Within functional limits for tasks assessed     SENSATION: WFL  POSTURE: Forward head, rounded shoulders.   UPPER EXTREMITY ROM:   Active ROM Right* eval Left eval  Shoulder flexion 165 165  Shoulder extension 80 75  Shoulder abduction 175 175  Shoulder adduction    Shoulder internal rotation F:T10p! F:T8  Shoulder external rotation F:T8 F:T8  (Blank rows = not tested)  UPPER EXTREMITY MMT:  MMT Right eval Left eval  Shoulder flexion 5 5  Shoulder  extension    Shoulder abduction 5 5  Shoulder internal rotation 5 5  Shoulder external rotation 5 5  Middle trapezius    Lower trapezius    Elbow flexion 5 5  Elbow extension 5 5  Wrist flexion 5 5  Wrist extension 5 5  Grip strength  good good  (Blank rows = not tested)  SHOULDER SPECIAL TESTS: Impingement tests: Hawkins/Kennedy impingement test: positive  Rotator cuff assessment: Empty can test: negative Biceps assessment: Yergason's test: negative  PALPATION:  Tenderness R anterior shoulder at [facility] joint and infraspinatus   TODAY'S TREATMENT:                                                                                                                                         DATE:   Therapeutic Exercise: to improve strength and mobility.  Demo, verbal and tactile cues throughout for technique. Seated shoulder rolls -retro Scap squeezes Chin tucks Pec stretch (demo only today) Self Care: Education on posture, desk set up, shoulder anatomy and impact posture has on shoulder   PATIENT EDUCATION: Education details: findings, POC, initial HEP Person educated: Patient Education method: Programmer, multimedia, Demonstration, Verbal cues, and Handouts Education comprehension: verbalized understanding and returned demonstration  HOME EXERCISE PROGRAM: Access Code: [email or web address] URL: [email or web address] Date: 01/23/2023 Prepared by: Harrie Foreman  Exercises - Seated Shoulder Rolls  - 3 x daily - 7 x weekly - 1 sets - 10 reps - Seated Cervical Retraction  - 3 x daily - 7 x weekly - 1 sets - 10 reps - 3 seconds  hold - Seated  Scapular Retraction  - 3 x daily - 7 x weekly - 1 sets - 10 reps - 5 seconds  hold - Doorway Pec Stretch at 60 Elevation  - 1 x daily - 7 x weekly - 1 sets - 3 reps - 15-30 sec  hold  ASSESSMENT:  CLINICAL IMPRESSION: Deanna Sellers  is a 52 y.o. right hand dominant female who was seen today for physical therapy evaluation and treatment for R shoulder  pain for ~ 9 months.  She demonstrates good ROM, only slightly limited today with reaching behind her back compared to L side, good strength, but tenderness over AC joint and infraspinatus.  Symptoms consistent with impingement.  Educated today on posture and shoulder anatomy, and started with exercises for posture.  We also discussed different modalities.  Deanna Sellers would benefit from skilled physical therapy to decrease R shoulder pain and allow full use of RUE without pain or limitation.   OBJECTIVE IMPAIRMENTS: decreased ROM, decreased strength, increased fascial restrictions, increased muscle spasms, impaired UE functional use, and pain.   ACTIVITY LIMITATIONS: carrying, lifting, bending, reach over head, and hygiene/grooming  PARTICIPATION LIMITATIONS: cleaning, laundry, and occupation  PERSONAL FACTORS: Time since onset of injury/illness/exacerbation are also affecting patient's functional outcome.   REHAB POTENTIAL: Good  CLINICAL DECISION MAKING: Stable/uncomplicated  EVALUATION COMPLEXITY: Low   GOALS: Goals reviewed with patient? Yes  SHORT TERM GOALS: Target date: 02/13/2023   Patient will be independent with initial HEP.  Baseline:  Goal status: INITIAL   LONG TERM GOALS: Target date: 03/20/2023   Patient will be independent with advanced/ongoing HEP to improve outcomes and carryover.  Baseline:  Goal status: INITIAL  2.  Patient will report 75% improvement in right shoulder pain to improve QOL.  Baseline:  Goal status: INITIAL  3.  Patient to improve right shoulder AROM to WNL without pain provocation to allow for increased ease of ADLs.  Baseline: pain with abduction, internal rotation, flexion Goal status: INITIAL  4.  Patient will demonstrate improved functional UE strength as demonstrated by being able to lift 25lbs bag overhead without increased R shoulder pain. Baseline: travels frequently, pain  Goal status: INITIAL  5.  Patient will report 10 points  improvement on QuickDash to demonstrate improved functional ability.  Baseline: 25% impairment Goal status: INITIAL  PLAN:  PT FREQUENCY: 1-2x/week  PT DURATION: 8 weeks (out of town for 2 weeks)  PLANNED INTERVENTIONS: Therapeutic exercises, Therapeutic activity, Neuromuscular re-education, Balance training, Gait training, Patient/Family education, Self Care, Joint mobilization, Dry Needling, Electrical stimulation, Spinal mobilization, Cryotherapy, Moist heat, Taping, Ultrasound, Ionotophoresis 4mg /ml Dexamethasone, Manual therapy, and Re-evaluation  PLAN FOR NEXT SESSION: shoulder trio exercises, manual therapy and modalities PRN (discussed TrDN but wait until returns from trip)   Jena Gauss, PT 01/23/2023, 6:21 PM

## 2023-01-25 ENCOUNTER — Ambulatory Visit: Payer: BC Managed Care – PPO | Admitting: Physical Therapy

## 2023-01-25 ENCOUNTER — Encounter: Payer: Self-pay | Admitting: Physical Therapy

## 2023-01-25 DIAGNOSIS — R293 Abnormal posture: Secondary | ICD-10-CM

## 2023-01-25 DIAGNOSIS — M25511 Pain in right shoulder: Secondary | ICD-10-CM | POA: Diagnosis not present

## 2023-01-25 DIAGNOSIS — G8929 Other chronic pain: Secondary | ICD-10-CM

## 2023-01-25 NOTE — Therapy (Addendum)
OUTPATIENT PHYSICAL THERAPY TREATMENT/Discharge   Patient Name: Deanna Sellers MRN: [id number] DOB:05-26-1971, 52 y.o., female Today's Date: 01/25/2023  END OF SESSION:  PT End of Session - 01/25/23 1704     Visit Number 2    Date for PT Re-Evaluation 03/20/23    Authorization Type BCBS    PT Start Time 1702    PT Stop Time 1745    PT Time Calculation (min) 43 min    Activity Tolerance Patient tolerated treatment well    Behavior During Therapy WFL for tasks assessed/performed             Past Medical History:  Diagnosis Date   Abnormal Pap smear of cervix    Anemia    Asthma    HLD (hyperlipidemia)    Hypothyroidism    Migraine    Urticaria    Vitamin D deficiency    Past Surgical History:  Procedure Laterality Date   CESAREAN SECTION N/A 1993   TUBAL LIGATION     Patient Active Problem List   Diagnosis Date Noted   Ganglion cyst 10/06/2020   Sciatica, left side 11/27/2019   Lumbar spondylosis 09/18/2019   Spondylolisthesis at L3-L4 level 06/10/2019   Heartburn 12/25/2018   Moderate asthma 11/05/2018   Anemia 05/20/2017   Obesity due to excess calories without serious comorbidity 05/20/2017   Intractable migraine with status migrainosus 02/16/2017   Hypothyroidism, acquired 03/28/2016    PCP: Gillian Scarce, MD   REFERRING PROVIDER: Rodolph Bong, MD  REFERRING DIAG: [phone] (ICD-10-CM) - Acute pain of right shoulder  THERAPY DIAG:  Chronic right shoulder pain  Abnormal posture  Rationale for Evaluation and Treatment: Rehabilitation  ONSET DATE: ~ 9 months ago (Nov 2023)  SUBJECTIVE:                                                                                                                                                                                      SUBJECTIVE STATEMENT: Deanna Sellers reports no changes, had been doing her initial exercises.   Shoulder hurts a little.  Hand dominance: Right  PERTINENT HISTORY: Anemia, migraine,  hypothyroidism, low back pain  PAIN:  Are you having pain? Yes: NPRS scale: 3/10 Pain location: R shoulder Pain description: throbbing Aggravating factors: sleeping on R side, wiping windows motion, reaching up with a weight Relieving factors: haven't tried anything.   PRECAUTIONS: None  WEIGHT BEARING RESTRICTIONS: No  FALLS:  Has patient fallen in last 6 months? No  OCCUPATION: Proofreader - travels a lot  PLOF: Independent  PATIENT GOALS: decrease pain  NEXT MD VISIT: none scheduled   OBJECTIVE:   DIAGNOSTIC FINDINGS:  12/15/22 Diagnostic Limited MSK Ultrasound of: Right shoulder: Biceps tendon normal-appearing  Tiny ganglion cyst associated with the biceps tendon in the bicipital groove Subscapularis tendon is normal. Supraspinatus tendon is intact with moderate subacromial bursitis present. Infraspinatus tendon normal. AC joint mild degenerative with a mild effusion. Impression: Subacromial bursitis  12/15/22 R shoulder DG IMPRESSION: Mild AC joint degenerative change. Mild irregularity at the rotator cuff insertion site.   PATIENT SURVEYS:  Quick Dash 25% impairment  COGNITION: Overall cognitive status: Within functional limits for tasks assessed     SENSATION: WFL  POSTURE: Forward head, rounded shoulders.   UPPER EXTREMITY ROM:   Active ROM Right* eval Left eval  Shoulder flexion 165 165  Shoulder extension 80 75  Shoulder abduction 175 175  Shoulder adduction    Shoulder internal rotation F:T10p! F:T8  Shoulder external rotation F:T8 F:T8  (Blank rows = not tested)  UPPER EXTREMITY MMT:  MMT Right eval Left eval  Shoulder flexion 5 5  Shoulder extension    Shoulder abduction 5 5  Shoulder internal rotation 5 5  Shoulder external rotation 5 5  Middle trapezius    Lower trapezius    Elbow flexion 5 5  Elbow extension 5 5  Wrist flexion 5 5  Wrist extension 5 5  Grip strength  good good  (Blank rows = not tested)  SHOULDER  SPECIAL TESTS: Impingement tests: Hawkins/Kennedy impingement test: positive  Rotator cuff assessment: Empty can test: negative Biceps assessment: Yergason's test: negative  PALPATION:  Tenderness R anterior shoulder at [facility] joint and infraspinatus   TODAY'S TREATMENT:                                                                                                                                         DATE:   01/25/23 Therapeutic Exercise: to improve strength and mobility.  Demo, verbal and tactile cues throughout for technique. UBE L1 x 6 min - (60f/3b) S/L R shoulder ER with towel - no weight 2 x 10 with slow tempo  S/L R shoulder flexion - no weight 2 x 10 with slow tempo S/L R shoulder horizontal abduction - no weight 2 x 10 with slow tempo Ultrasound: x 8 min to R posterior shoulder 1 MHz, 1.2 w/cm2 cont to decrease inflammation/pain   01/23/23 Therapeutic Exercise: to improve strength and mobility.  Demo, verbal and tactile cues throughout for technique. Seated shoulder rolls -retro Scap squeezes Chin tucks Pec stretch (demo only today) Self Care: Education on posture, desk set up, shoulder anatomy and impact posture has on shoulder   PATIENT EDUCATION: Education details: HEP update  Person educated: Patient Education method: Explanation, Demonstration, Verbal cues, and Handouts Education comprehension: verbalized understanding and returned demonstration  HOME EXERCISE PROGRAM: Access Code: [email or web address] URL: [email or web address] Date: 01/25/2023 Prepared by: Harrie Foreman  Exercises - Seated Shoulder Rolls  - 3 x daily - 7 x weekly -  1 sets - 10 reps - Seated Cervical Retraction  - 3 x daily - 7 x weekly - 1 sets - 10 reps - 3 seconds  hold - Seated Scapular Retraction  - 3 x daily - 7 x weekly - 1 sets - 10 reps - 5 seconds  hold - Doorway Pec Stretch at 60 Elevation  - 1 x daily - 7 x weekly - 1 sets - 3 reps - 15-30 sec  hold - Sidelying Shoulder ER with  Towel and Dumbbell  - 1 x daily - 7 x weekly - 3 sets - 10 reps - Sidelying Shoulder Flexion 15 Degrees  - 1 x daily - 7 x weekly - 3 sets - 10 reps - Sidelying Shoulder Horizontal Abduction  - 1 x daily - 7 x weekly - 3 sets - 10 reps   ASSESSMENT:  CLINICAL IMPRESSION: Deanna Sellers  reports no changes since IE.  She tolerated progression of exercises today with minimal discomfort, focusing on shoulder trio in sidelying for strengthening.  [place] applied at end of session to decrease inflammation and pain.  Noted during conversation that she has had previous bad experience in PT, reassured that we will work together to not repeat that experience.  Deanna Sellers continues to demonstrate potential for improvement and would benefit from continued skilled therapy to address impairments.     OBJECTIVE IMPAIRMENTS: decreased ROM, decreased strength, increased fascial restrictions, increased muscle spasms, impaired UE functional use, and pain.   ACTIVITY LIMITATIONS: carrying, lifting, bending, reach over head, and hygiene/grooming  PARTICIPATION LIMITATIONS: cleaning, laundry, and occupation  PERSONAL FACTORS: Time since onset of injury/illness/exacerbation are also affecting patient's functional outcome.   REHAB POTENTIAL: Good  CLINICAL DECISION MAKING: Stable/uncomplicated  EVALUATION COMPLEXITY: Low   GOALS: Goals reviewed with patient? Yes  SHORT TERM GOALS: Target date: 02/13/2023   Patient will be independent with initial HEP.  Baseline:  Goal status: IN PROGRESS   LONG TERM GOALS: Target date: 03/20/2023   Patient will be independent with advanced/ongoing HEP to improve outcomes and carryover.  Baseline:  Goal status: IN PROGRESS  2.  Patient will report 75% improvement in right shoulder pain to improve QOL.  Baseline:  Goal status: IN PROGRESS  3.  Patient to improve right shoulder AROM to WNL without pain provocation to allow for increased ease of ADLs.  Baseline: pain with  abduction, internal rotation, flexion Goal status: IN PROGRESS  4.  Patient will demonstrate improved functional UE strength as demonstrated by being able to lift 25lbs bag overhead without increased R shoulder pain. Baseline: travels frequently, pain  Goal status: IN PROGRESS  5.  Patient will report 10 points improvement on QuickDash to demonstrate improved functional ability.  Baseline: 25% impairment Goal status: IN PROGRESS  PLAN:  PT FREQUENCY: 1-2x/week  PT DURATION: 8 weeks (out of town for 2 weeks)  PLANNED INTERVENTIONS: Therapeutic exercises, Therapeutic activity, Neuromuscular re-education, Balance training, Gait training, Patient/Family education, Self Care, Joint mobilization, Dry Needling, Electrical stimulation, Spinal mobilization, Cryotherapy, Moist heat, Taping, Ultrasound, Ionotophoresis 4mg /ml Dexamethasone, Manual therapy, and Re-evaluation  PLAN FOR NEXT SESSION: review and progress exercises, manual therapy and modalities PRN -caution: previous bad experience in PT   Jena Gauss, PT 01/25/2023, 5:55 PM   PHYSICAL THERAPY DISCHARGE SUMMARY  Visits from Start of Care: 2  Current functional level related to goals / functional outcomes: See above   Remaining deficits: See above   Education / Equipment: HEP  Plan: Patient goals were  not met. Patient is being discharged due to not returning to therapy after 01/25/2023 visit and no-showing for follow-up.    Jena Gauss, PT, DPT 03/05/2023 4:06 PM

## 2023-01-26 ENCOUNTER — Ambulatory Visit: Payer: BC Managed Care – PPO

## 2023-01-30 ENCOUNTER — Encounter: Payer: Managed Care, Other (non HMO) | Admitting: Physical Therapy

## 2023-02-06 ENCOUNTER — Encounter: Payer: Managed Care, Other (non HMO) | Admitting: Physical Therapy

## 2023-02-09 ENCOUNTER — Encounter: Payer: Managed Care, Other (non HMO) | Admitting: Physical Therapy

## 2023-02-13 ENCOUNTER — Ambulatory Visit: Payer: BC Managed Care – PPO | Admitting: Physical Therapy

## 2023-03-23 ENCOUNTER — Ambulatory Visit (INDEPENDENT_AMBULATORY_CARE_PROVIDER_SITE_OTHER): Payer: BC Managed Care – PPO | Admitting: Family Medicine

## 2023-03-23 ENCOUNTER — Other Ambulatory Visit: Payer: Self-pay

## 2023-03-23 ENCOUNTER — Encounter: Payer: Self-pay | Admitting: Family Medicine

## 2023-03-23 VITALS — BP 122/84 | HR 80 | Ht 64.5 in | Wt 229.8 lb

## 2023-03-23 DIAGNOSIS — G8929 Other chronic pain: Secondary | ICD-10-CM

## 2023-03-23 DIAGNOSIS — M25511 Pain in right shoulder: Secondary | ICD-10-CM

## 2023-03-23 NOTE — Progress Notes (Signed)
   I, Stevenson Clinch, CMA acting as a scribe for [name], MD.  Deanna Sellers is a 52 y.o. female who presents to [facility] at [facility] today for cont'd R shoulder pain. She is RHD. Pt was last seen by Dr. [name] on 12/15/22 and she was referred to PT, completing 2 visits (canceling/no-show remaining scheduled visits).  Today, pt reports possible over exertion over the weekend, played [employer]. Locates pain to deep in the denies. Denies radiating pain, n/t, weakness, decreased grip strength.    Pertinent review of systems: No fevers or chills  Relevant historical information: History of migraine   Exam:  BP 122/84   Pulse 80   Ht 5' 4.5" (1.638 m)   Wt 229 lb 12.8 oz (104.2 kg)   BMI 38.84 kg/m  General: Well Developed, well nourished, and in no acute distress.   MSK: Right shoulder normal-appearing Range of motion Limited abduction.  Intact strength.  Positive Hawkins and Neer's test.    Lab and Radiology Results  Procedure: Real-time Ultrasound Guided Injection of right shoulder subacromial bursa Device: Philips Affiniti 50G/GE Logiq Images permanently stored and available for review in PACS Verbal informed consent obtained.  Discussed risks and benefits of procedure. Warned about infection, bleeding, hyperglycemia damage to structures among others. Patient expresses understanding and agreement Time-out conducted.   Noted no overlying erythema, induration, or other signs of local infection.   Skin prepped in a sterile fashion.   Local anesthesia: Topical Ethyl chloride.   With sterile technique and under real time ultrasound guidance: 40 mg of Kenalog and 2 mL of Marcaine injected into subacromial bursa. Fluid seen entering the bursa.   Completed without difficulty   Pain immediately resolved suggesting accurate placement of the medication.   Advised to call if fevers/chills, erythema, induration, drainage, or persistent bleeding.   Images permanently  stored and available for review in the ultrasound unit.  Impression: Technically successful ultrasound guided injection.    EXAM: RIGHT SHOULDER - 2+ VIEW   COMPARISON:  None Available.   FINDINGS: Mild AC joint degenerative change. No fracture or dislocation. Mild irregularity at the rotator cuff insertion site.   IMPRESSION: Mild AC joint degenerative change. Mild irregularity at the rotator cuff insertion site.     Electronically Signed   By: Gerome Sam III M.D.   On: 12/15/2022 15:29   I, [name], personally (independently) visualized and performed the interpretation of the images attached in this note.      Assessment and Plan: 52 y.o. female with right shoulder pain thought to be due to subacromial bursitis and impingement.  There may be more rotator cuff dysfunction than is visible on ultrasound.  Plan for subacromial injection today and continued home exercise program.  If not improved consider MRI for potential surgical planning.   PDMP not reviewed this encounter. Orders Placed This Encounter  Procedures   [place] LIMITED JOINT SPACE STRUCTURES UP RIGHT(NO LINKED CHARGES)    Order Specific Question:   Reason for Exam (SYMPTOM  OR DIAGNOSIS REQUIRED)    Answer:   right shoulder pain    Order Specific Question:   Preferred imaging location?    Answer:    [facility]   No orders of the defined types were placed in this encounter.    Discussed warning signs or symptoms. Please see discharge instructions. Patient expresses understanding.   The above documentation has been reviewed and is accurate and complete [name], M.D.

## 2023-03-23 NOTE — Patient Instructions (Addendum)
Thank you for coming in today.   You received an injection today. Seek immediate medical attention if the joint becomes red, extremely painful, or is oozing fluid.   If not getting better, let me know, and I'll order a MRI.

## 2023-07-19 NOTE — Progress Notes (Signed)
   LILLETTE Ileana Collet, PhD, LAT, ATC acting as a scribe for Artist Lloyd, MD.  Deanna Sellers is a 53 y.o. female who presents to [facility] at [facility] today for exacerbation of her R shoulder pain. Pt was last seen by Dr. Lloyd on 03/23/23 and was given a R subacromial steroid injection and advised to cont HEP.  Today, pt reports R shoulder pain returned a couple wks ago. She was helping remodel a kitchen and was lifting heavy objects.   Her and her boyfriend remodeled her mother's kitchen in [place] .  Dx imaging: 12/15/22 R shoulder XR  Pertinent review of systems: No fevers or chills  Relevant historical information: Asthma   Exam:  BP 130/84   Pulse 86   Ht 5' 4.5 (1.638 m)   Wt 218 lb (98.9 kg)   SpO2 97%   BMI 36.84 kg/m  General: Well Developed, well nourished, and in no acute distress.   MSK: Right shoulder normal-appearing normal motion pain with abduction.  Intact strength.  Positive impingement testing.    Lab and Radiology Results  Procedure: Real-time Ultrasound Guided Injection of right shoulder subacromial bursa Device: Philips Affiniti 50G/GE Logiq Images permanently stored and available for review in PACS Verbal informed consent obtained.  Discussed risks and benefits of procedure. Warned about infection, bleeding, hyperglycemia damage to structures among others. Patient expresses understanding and agreement Time-out conducted.   Noted no overlying erythema, induration, or other signs of local infection.   Skin prepped in a sterile fashion.   Local anesthesia: Topical Ethyl chloride.   With sterile technique and under real time ultrasound guidance: 40 mg of Kenalog  and 2 mL of Marcaine injected into subacromial bursa. Fluid seen entering the bursa.   Completed without difficulty   Pain immediately resolved suggesting accurate placement of the medication.   Advised to call if fevers/chills, erythema, induration, drainage, or persistent bleeding.    Images permanently stored and available for review in the ultrasound unit.  Impression: Technically successful ultrasound guided injection.        Assessment and Plan: 53 y.o. female with right shoulder pain due to impingement and bursitis.  This is an acute exacerbation of a chronic problem ongoing since at least May 2024.  Plan for repeat injection today and continued home exercise program.  If not improving consider MRI to further evaluate source of pain and for surgical planning.   PDMP not reviewed this encounter. Orders Placed This Encounter  Procedures   US  LIMITED JOINT SPACE STRUCTURES UP RIGHT(NO LINKED CHARGES)    Reason for Exam (SYMPTOM  OR DIAGNOSIS REQUIRED):   right should pain    Preferred imaging location?:   [facility]   No orders of the defined types were placed in this encounter.    Discussed warning signs or symptoms. Please see discharge instructions. Patient expresses understanding.   The above documentation has been reviewed and is accurate and complete Artist Lloyd, M.D.

## 2023-07-20 ENCOUNTER — Ambulatory Visit (INDEPENDENT_AMBULATORY_CARE_PROVIDER_SITE_OTHER): Payer: BC Managed Care – PPO | Admitting: Family Medicine

## 2023-07-20 ENCOUNTER — Other Ambulatory Visit: Payer: Self-pay

## 2023-07-20 VITALS — BP 130/84 | HR 86 | Ht 64.5 in | Wt 218.0 lb

## 2023-07-20 DIAGNOSIS — M25511 Pain in right shoulder: Secondary | ICD-10-CM

## 2023-07-20 DIAGNOSIS — G8929 Other chronic pain: Secondary | ICD-10-CM

## 2023-07-20 NOTE — Patient Instructions (Addendum)
 Thank you for coming in today.   You received an injection today. Seek immediate medical attention if the joint becomes red, extremely painful, or is oozing fluid.   Check back as needed

## 2023-09-04 ENCOUNTER — Emergency Department (HOSPITAL_BASED_OUTPATIENT_CLINIC_OR_DEPARTMENT_OTHER)
Admission: EM | Admit: 2023-09-04 | Discharge: 2023-09-04 | Disposition: A | Discharge status: Home / Self Care | Payer: BC Managed Care – PPO | Attending: Emergency Medicine | Admitting: Emergency Medicine

## 2023-09-04 ENCOUNTER — Emergency Department (HOSPITAL_BASED_OUTPATIENT_CLINIC_OR_DEPARTMENT_OTHER): Payer: BC Managed Care – PPO

## 2023-09-04 ENCOUNTER — Encounter (HOSPITAL_BASED_OUTPATIENT_CLINIC_OR_DEPARTMENT_OTHER): Payer: Self-pay

## 2023-09-04 ENCOUNTER — Other Ambulatory Visit: Payer: Self-pay

## 2023-09-04 DIAGNOSIS — R0602 Shortness of breath: Secondary | ICD-10-CM | POA: Insufficient documentation

## 2023-09-04 DIAGNOSIS — R42 Dizziness and giddiness: Secondary | ICD-10-CM | POA: Insufficient documentation

## 2023-09-04 DIAGNOSIS — Z91040 Latex allergy status: Secondary | ICD-10-CM | POA: Insufficient documentation

## 2023-09-04 DIAGNOSIS — R079 Chest pain, unspecified: Secondary | ICD-10-CM | POA: Insufficient documentation

## 2023-09-04 LAB — PREGNANCY, URINE: Preg Test, Ur: NEGATIVE

## 2023-09-04 LAB — CBC
HCT: 44.6 % (ref 36.0–46.0)
Hemoglobin: 14.9 g/dL (ref 12.0–15.0)
MCH: 32.3 pg (ref 26.0–34.0)
MCHC: 33.4 g/dL (ref 30.0–36.0)
MCV: 96.5 fL (ref 80.0–100.0)
Platelets: 424 10*3/uL — ABNORMAL HIGH (ref 150–400)
RBC: 4.62 MIL/uL (ref 3.87–5.11)
RDW: 12.4 % (ref 11.5–15.5)
WBC: 3 10*3/uL — ABNORMAL LOW (ref 4.0–10.5)
nRBC: 0 % (ref 0.0–0.2)

## 2023-09-04 LAB — TROPONIN I (HIGH SENSITIVITY)
Troponin I (High Sensitivity): 2 ng/L (ref <18)
Troponin I (High Sensitivity): <2 ng/L (ref <18)

## 2023-09-04 LAB — BASIC METABOLIC PANEL
Anion gap: 10 (ref 5–15)
BUN: 15 mg/dL (ref 6–20)
CO2: 24 mmol/L (ref 22–32)
Calcium: 9.1 mg/dL (ref 8.9–10.3)
Chloride: 100 mmol/L (ref 98–111)
Creatinine, Ser: 1.17 mg/dL — ABNORMAL HIGH (ref 0.44–1.00)
GFR, Estimated: 56 mL/min — ABNORMAL LOW (ref >60)
Glucose, Bld: 88 mg/dL (ref 70–99)
Potassium: 4.3 mmol/L (ref 3.5–5.1)
Sodium: 134 mmol/L — ABNORMAL LOW (ref 135–145)

## 2023-09-04 LAB — D-DIMER, QUANTITATIVE: D-Dimer, Quant: <0.27 ug{FEU}/mL (ref 0.00–0.50)

## 2023-09-04 MED ORDER — IOHEXOL 350 MG/ML SOLN
100.0000 mL | Freq: Once | INTRAVENOUS | Status: AC | PRN
  Administered 2023-09-04: 75 mL via INTRAVENOUS

## 2023-09-04 MED ORDER — MELOXICAM 7.5 MG PO TABS: 7.5000 mg | ORAL_TABLET | Freq: Every day | ORAL | 0 refills | Status: AC

## 2023-09-04 NOTE — ED Provider Notes (Signed)
 [place] [facility] Provider Note   CSN: [id number] Arrival date & time: 09/04/23  6578     History  Chief Complaint  Patient presents with   Chest Pain    Deanna Sellers is a 53 y.o. female.  Patient presents to the emergency department today for evaluation of chest pain and shortness of breath.  Also reports dizziness.  She has a history of low thyroid.  The chest pains started about 5 days ago.  She reports this as an intermittent achy pain in the mid chest.  About 2 days ago she developed shortness of breath and dizziness with exertion.  She describes this as a off-balance sensation.  She reports that getting up from her living room and walking to her kitchen, she feels like she has to stop and take a break.  No syncope.  No URI symptoms.  She does get daily headaches.  Patient denies risk factors for pulmonary embolism including: unilateral leg swelling/pain, history of DVT/PE/other blood clots, use of exogenous hormones, recent immobilizations, recent surgery, recent travel (>4hr segment), malignancy, hemoptysis.  Patient does report sister and father have had blood clots.  She thinks that sister is on a medication to prevent them.       Home Medications Prior to Admission medications   Medication Sig Start Date End Date Taking? Authorizing Provider  albuterol (PROAIR HFA) 108 (90 Base) MCG/ACT inhaler ProAir HFA 90 mcg/actuation aerosol inhaler 04/16/16   [provider]  budesonide-formoterol (SYMBICORT) 80-4.5 MCG/ACT inhaler Symbicort 80 mcg-4.5 mcg/actuation HFA aerosol inhaler    [provider]  cetirizine (ZYRTEC) 10 MG tablet  04/16/16   [provider]  EPINEPHrine 0.3 mg/0.3 mL IJ SOAJ injection Inject into the muscle. 11/05/18   [provider]  famotidine (PEPCID) 40 MG tablet Take by mouth. 12/25/18   [provider]  ferrous sulfate 325 (65 FE) MG tablet Take by mouth.    [provider]  fluconazole (DIFLUCAN) 200 MG tablet  05/13/19   [provider]  hydrochlorothiazide (HYDRODIURIL) 25 MG tablet Take 1/2 - 1 tab po in the morning. 02/21/18   [provider]  ipratropium-albuterol (DUONEB) 0.5-2.5 (3) MG/3ML SOLN Inhale into the lungs. 08/19/18   [provider]  levothyroxine (SYNTHROID) 137 MCG tablet Synthroid 137 mcg tablet  Take 1 tablet every day by oral route.    [provider]  montelukast (SINGULAIR) 10 MG tablet Take by mouth. 06/05/18 06/05/19  [provider]  predniSONE (DELTASONE) 50 MG tablet Take 1 tablet (50 mg total) by mouth daily. Patient not taking: Reported on 07/20/2023 08/26/20   Rodolph Bong, MD      Allergies    Shellfish allergy, Cinnamon, Latex, and Povidone iodine    Review of Systems   Review of Systems  Physical Exam Updated Vital Signs BP (!) 150/102   Pulse 99   Temp (!) 97.5 F (36.4 C) (Oral)   Resp 13   Ht 5' 4.5" (1.638 m)   Wt 94.8 kg   LMP 08/27/2023 (Approximate)   SpO2 100%   BMI 35.32 kg/m  Physical Exam Vitals and nursing note reviewed.  Constitutional:      Appearance: She is well-developed. She is not diaphoretic.  HENT:     Head: Normocephalic and atraumatic.     Mouth/Throat:     Mouth: Mucous membranes are not dry.  Eyes:     Conjunctiva/sclera: Conjunctivae normal.  Neck:  Vascular: Normal carotid pulses. No JVD.     Trachea: Trachea normal. No tracheal deviation.  Cardiovascular:     Rate and Rhythm: Normal rate and regular rhythm.     Pulses: No decreased pulses.          Radial pulses are 2+ on the right side and 2+ on the left side.     Heart sounds: Normal heart sounds, S1 normal and S2 normal. No murmur heard.    Comments: No murmur heard Pulmonary:     Effort: Pulmonary effort is normal. No respiratory distress.     Breath sounds: No wheezing.  Chest:     Chest wall: No tenderness.  Abdominal:     General: Bowel sounds are normal.      Palpations: Abdomen is soft.     Tenderness: There is no abdominal tenderness. There is no guarding or rebound.  Musculoskeletal:        General: Normal range of motion.     Cervical back: Normal range of motion and neck supple. No muscular tenderness.     Right lower leg: No tenderness. No edema.     Left lower leg: No tenderness. No edema.  Skin:    General: Skin is warm and dry.     Coloration: Skin is not pale.  Neurological:     Mental Status: She is alert.     ED Results / Procedures / Treatments   Labs (all labs ordered are listed, but only abnormal results are displayed) Labs Reviewed  BASIC METABOLIC PANEL - Abnormal; Notable for the following components:      Result Value   Sodium 134 (*)    Creatinine, Ser 1.17 (*)    GFR, Estimated 56 (*)    All other components within normal limits  CBC - Abnormal; Notable for the following components:   WBC 3.0 (*)    Platelets 424 (*)    All other components within normal limits  PREGNANCY, URINE  D-DIMER, QUANTITATIVE  TROPONIN I (HIGH SENSITIVITY)  TROPONIN I (HIGH SENSITIVITY)    EKG EKG Interpretation Date/Time:  Tuesday September 04 2023 09:16:39 EST Ventricular Rate:  99 PR Interval:  141 QRS Duration:  86 QT Interval:  338 QTC Calculation: 434 R Axis:   -1  Text Interpretation: Sinus rhythm Low voltage, precordial leads Confirmed by Ernie Avena ([id number]) on 09/04/2023 10:33:09 AM  Radiology DG Chest 2 View Result Date: 09/04/2023 CLINICAL DATA:  Chest pain and shortness of breath EXAM: CHEST - 2 VIEW COMPARISON:  Chest radiograph 12/29/2021 FINDINGS: The heart size and mediastinal contours are within normal limits. Both lungs are clear. The visualized skeletal structures are unremarkable. IMPRESSION: No active cardiopulmonary disease. Electronically Signed   By: Annia Belt M.D.   On: 09/04/2023 10:16    Procedures Procedures    Medications Ordered in ED Medications - No data to display  ED Course/  Medical Decision Making/ A&P    Patient seen and examined. History obtained directly from patient. Work-up including labs, imaging, EKG ordered in triage, if performed, were reviewed.    Labs/EKG: Independently reviewed and interpreted.  This included: CBC shows mildly low white blood cell count at 3.0, slightly elevated platelets of 424 otherwise unremarkable; BMP with sodium 134, creatinine minimally elevated at 1.17 with normal BUN; troponin 2 and normal.  EKG as above, sinus rhythm.  Added D-dimer.  Imaging: Independently visualized and interpreted.  This included: Chest x-ray agree normal, normal heart size.  Medications/Fluids: None ordered  Most recent vital signs reviewed and are as follows: BP (!) 150/102   Pulse 99   Temp (!) 97.5 F (36.4 C) (Oral)   Resp 13   Ht 5' 4.5" (1.638 m)   Wt 94.8 kg   LMP 08/27/2023 (Approximate)   SpO2 100%   BMI 35.32 kg/m   Initial impression: Chest pain and shortness of breath.  Patient low risk [place].  Will screen with D-dimer.  12:47 PM Reassessment performed. Patient appears comfortable.  Labs personally reviewed and interpreted including: Troponin 2 >> less than 2.  D-dimer was negative.  Reviewed pertinent lab work and imaging with patient at bedside. Questions answered.   Most current vital signs reviewed and are as follows: BP (!) 134/110 Comment: Standing  Pulse 86   Temp (!) 97.5 F (36.4 C) (Oral)   Resp 20   Ht 5' 4.5" (1.638 m)   Wt 94.8 kg   LMP 08/27/2023 (Approximate)   SpO2 97%   BMI 35.32 kg/m   Plan: Workup reassuring.  Patient ambulated without desat, but heart rate increased to 128.  She was symptomatic with ambulation.  I went and had a shared decision-making discussion with the patient.  We discussed reassuring workup.  We discussed that 1 option would be to just monitor closely symptoms at home and have her follow-up with her PCP, with return with worsening.  We also discussed the possibility and option  of obtaining CT of the chest to look for other possible etiologies of shortness of breath and to rule out blood clot definitively.  After good discussion, patient feels she would prefer to have additional imaging today.  CTA of the chest ordered.  4:46 PM Reassessment performed. Patient appears stable, comfortable.  She continues to have tachycardia when she stands up and goes to the restroom.  Imaging personally visualized and interpreted including: CT of the chest, agree negative.  Reviewed pertinent lab work and imaging with patient at bedside.  Family member also at bedside.  Questions answered.   Most current vital signs reviewed and are as follows: BP (!) 146/90   Pulse 88   Temp (!) 97.5 F (36.4 C)   Resp 15   Ht 5' 4.5" (1.638 m)   Wt 94.8 kg   LMP 08/27/2023 (Approximate)   SpO2 100%   BMI 35.32 kg/m   Plan: Discharge to home.   Prescriptions written for: Meloxicam trial  Other home care instructions discussed: Rest, maintain good hydration  ED return instructions discussed: Return with worsening symptoms, fever, syncope, new or worsening symptoms  Follow-up instructions discussed: Patient encouraged to follow-up with their PCP in 3 days.                                 Medical Decision Making Amount and/or Complexity of Data Reviewed Labs: ordered. Radiology: ordered.  Risk Prescription drug management.   For this patient's complaint of chest pain, the following emergent conditions were considered on the differential diagnosis: acute coronary syndrome, pulmonary embolism, pneumothorax, myocarditis, pericardial tamponade, aortic dissection, thoracic aortic aneurysm complication, esophageal perforation.   Other causes were also considered including: gastroesophageal reflux disease, musculoskeletal pain including costochondritis, pneumonia/pleurisy, herpes zoster, pericarditis.  In regards to possibility of ACS, patient has atypical features of pain, non-ischemic  and unchanged EKG and negative troponin(s).   In regards to possibility of PE, symptoms are atypical for PE, CTA negative.  Patient continues to have some  symptoms and tachycardia with standing and walking, however workup in the ED today is extremely reassuring.  Will treat patient for chest wall pain and have her follow-up as outpatient for further evaluation.  The patient's vital signs, pertinent lab work and imaging were reviewed and interpreted as discussed in the ED course. Hospitalization was considered for further testing, treatments, or serial exams/observation. However as patient is well-appearing, has a stable exam, and reassuring studies today, I do not feel that they warrant admission at this time. This plan was discussed with the patient who verbalizes agreement and comfort with this plan and seems reliable and able to return to the Emergency Department with worsening or changing symptoms.          Final Clinical Impression(s) / ED Diagnoses Final diagnoses:  Dizziness  Chest pain, unspecified type    Rx / DC Orders ED Discharge Orders          Ordered    meloxicam (MOBIC) 7.5 MG tablet  Daily        09/04/23 1638              Renne Crigler, PA-C 09/04/23 1649    Ernie Avena, MD 09/08/23 [phone]

## 2023-09-04 NOTE — ED Notes (Signed)
IV would not thread, removed.

## 2023-09-04 NOTE — ED Triage Notes (Signed)
C/o intermittent mid/left chest pain x 4 days. Also has dizziness, feeling flushed and shortness of breath with exertion.

## 2023-09-04 NOTE — ED Notes (Signed)

## 2023-09-04 NOTE — Discharge Instructions (Addendum)
Please read and follow all provided instructions.  Your diagnoses today include:  1. Dizziness   2. Chest pain, unspecified type     Tests performed today include: An EKG of your heart A chest x-ray Cardiac enzymes - a blood test for heart muscle damage, were normal Blood counts and electrolytes CT scan of your chest: Did not show any abnormalities such as blood clot or other problems with the heart or lungs Vital signs. See below for your results today.   Medications prescribed:  Meloxicam - anti-inflammatory pain medication  You have been prescribed an anti-inflammatory medication or NSAID. Take with food. Do not take aspirin, ibuprofen, or naproxen if taking this medication. Take smallest effective dose for the shortest duration needed for your pain. Stop taking if you experience stomach pain or vomiting.   Take any prescribed medications only as directed.  Follow-up instructions: Please follow-up with your primary care provider as soon as you can for further evaluation of your symptoms.   Return instructions:  SEEK IMMEDIATE MEDICAL ATTENTION IF: You have severe chest pain, especially if the pain is crushing or pressure-like and spreads to the arms, back, neck, or jaw, or if you have sweating, nausea or vomiting, or trouble with breathing. THIS IS AN EMERGENCY. Do not wait to see if the pain will go away. Get medical help at once. Call 911. DO NOT drive yourself to the hospital.  Your chest pain gets worse and does not go away after a few minutes of rest.  You have an attack of chest pain lasting longer than what you usually experience.  You have significant dizziness, if you pass out, or have trouble walking.  You have chest pain not typical of your usual pain for which you originally saw your caregiver.  You have any other emergent concerns regarding your health.  Additional Information: Chest pain comes from many different causes. Your caregiver has diagnosed you as having  chest pain that is not specific for one problem, but does not require admission.  You are at low risk for an acute heart condition or other serious illness.   Your vital signs today were: BP (!) 146/90   Pulse 88   Temp (!) 97.5 F (36.4 C)   Resp 15   Ht 5' 4.5" (1.638 m)   Wt 94.8 kg   LMP 08/27/2023 (Approximate)   SpO2 100%   BMI 35.32 kg/m  If your blood pressure (BP) was elevated above 135/85 this visit, please have this repeated by your doctor within one month. --------------

## 2023-09-04 NOTE — ED Notes (Signed)
   09/04/23 1224  Resting  Supplemental oxygen during test? No  Resting Heart Rate 87  Resting Sp02 100  Lap 1 (250 feet)  HR 128  02 Sat 98   Ambulated on room air, SpO2 98-100%, HR upon standing increased to 120s.  C/O cheat pain when standing 6 of 10 throbbing.

## 2023-09-05 ENCOUNTER — Ambulatory Visit: Payer: BC Managed Care – PPO | Attending: Cardiology | Admitting: Cardiology

## 2023-09-05 VITALS — BP 126/92 | HR 80 | Ht 64.0 in | Wt 213.0 lb

## 2023-09-05 DIAGNOSIS — R072 Precordial pain: Secondary | ICD-10-CM | POA: Diagnosis not present

## 2023-09-05 DIAGNOSIS — E782 Mixed hyperlipidemia: Secondary | ICD-10-CM | POA: Diagnosis not present

## 2023-09-05 NOTE — Patient Instructions (Signed)
Medication Instructions:   Your physician recommends that you continue on your current medications as directed. Please refer to the Current Medication list given to you today.  *If you need a refill on your cardiac medications before your next appointment, please call your pharmacy*    Follow-Up:  AS NEEDED WITH DR. PATWARDHAN

## 2023-09-05 NOTE — Progress Notes (Signed)
Cardiology Office Note:  .   Date:  09/05/2023  ID:  Deanna Sellers, DOB January 30, 1971, MRN [id number] PCP: Deanna Guthrie, MD  [facility] Providers Cardiologist:  Truett Mainland, MD PCP: Deanna Guthrie, MD  Chief Complaint  Patient presents with   Hospitalization Follow-up      History of Present Illness: .    Deanna Sellers is a 53 y.o. 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 The pain is located in the chest area and is accompanied by shortness of breath, dizziness, and sweating. The patient reports that these symptoms have been so severe that she has had difficulty walking short distances and has had to sit down frequently. The patient also reports that her fingers were turning purple and that her heart was racing. The patient notes that the chest pain is constant and is present even when she is not exerting herself. The patient also reports that she has not been drinking as much water as she usually does in the past few days. The patient has a history of migraines and takes hydrochlorothiazide as needed for this condition. The patient's cholesterol was slightly high in the past, but it has been coming down.  Workup in the ER showed normal troponin, CTA with no PE, no coronary calcification documented.   Vitals:   09/05/23 1040  BP: (!) 126/92  Pulse: 80     ROS:  Review of Systems  Cardiovascular:  Positive for chest pain and dyspnea on exertion. Negative for leg swelling, palpitations and syncope.     Studies Reviewed: Marland Kitchen        EKG 09/04/2023: Sinus rhythm 99 bpm Low voltage precordial leads, otherwise normal EKG  Independently interpreted 08/2023: Cr 1.17 Trop HS 2, <2  Independently interpreted 04/2023: Chol 204, TG 81, HDL 66, LDL 120 Cr 1.05, eGFR 54  10/979: Chol 257,  TG 91, HDL 60, LDL 176  CTA chest 09/04/2023: No pulmonary embolus or acute intrathoracic abnormality.   Echocardiogram 03/10/2022: There is normal left ventricular wall thickness.  The left ventricular size is normal.  LV ejection fraction = 55-60%.  Left ventricular systolic function is normal.  RVSP not able to be calculated.   Holter monitor 02/2022: 1.  Normal Holter monitor.  2.  Arrhythmia present as described  3.  Symptoms were not reported  4.  Symptoms were not correlated with arrhythmias       Physical Exam:   Physical Exam Vitals and nursing note reviewed.  Constitutional:      General: She is not in acute distress. Neck:     Vascular: No JVD.  Cardiovascular:     Rate and Rhythm: Normal rate and regular rhythm.     Heart sounds: Normal heart sounds. No murmur heard. Pulmonary:     Effort: Pulmonary effort is normal.     Breath sounds: Normal breath sounds. No wheezing or rales.  Chest:     Chest wall: Tenderness (Focal chest wall tenderness) present.  Musculoskeletal:     Right lower leg: No edema.     Left lower leg: No edema.      VISIT DIAGNOSES:   ICD-10-CM   1. Precordial pain  R07.2     2. Mixed hyperlipidemia  E78.2        ASSESSMENT AND PLAN: .  [name] is a 53 y.o. female with hyperlipidemia, family h/o early CAD, with chest pain  Chest pain: Very reproducible, chest wall tenderness, most likely costochondritis.  While she has family h/o early CAD and personal h/o mixed hyperlipidemia, ACS and PE were excluded in ER on 09/04/2023.  There was no mention of coronary calcification.   Structurally normal heart on echocardiogram and 2023. My suspicion for cardiac etiology for this pain is extremely low. Recent lightheadedness most likely related to dehydration.  Recommend increasing fluid intake to 4-6 water bottles every day.  Recommend taking meloxicam, as prescribed by ER, with food for next 2 weeks.  Consider local heat  application.  She has follow-up with PCP in March.  This can be further reassessed.  I do not recommend any further cardiac testing at this time.  Mixed hyperlipidemia: She reportedly was on a statin until recently, but has ran out.  She has upcoming follow-up with PCP in March where lipid panel can be readdressed.  If LDL remains greater than sign 100, consider at least low-dose statin.  Elevated blood pressure without diagnosis of hypertension: Diastolic blood pressure 96 today.  She has hydrochlorothiazide for as needed use, which she only takes when she has migraine. Again, blood pressure can be assessed at upcoming PCP visit in March.  We will see her as needed.  Signed, Elder Negus, MD

## 2024-02-29 ENCOUNTER — Other Ambulatory Visit (HOSPITAL_COMMUNITY): Payer: Self-pay

## 2024-02-29 ENCOUNTER — Ambulatory Visit (INDEPENDENT_AMBULATORY_CARE_PROVIDER_SITE_OTHER): Payer: Self-pay

## 2024-02-29 VITALS — BP 114/90 | HR 90

## 2024-02-29 DIAGNOSIS — R238 Other skin changes: Secondary | ICD-10-CM

## 2024-02-29 MED ORDER — LIDOCAINE 23% - TETRACAINE 7% TOPICAL OINTMENT (PLASTICIZED)
1.0000 | TOPICAL_OINTMENT | Freq: Once | CUTANEOUS | 0 refills | Status: AC
  Filled 2024-02-29: qty 60, 15d supply, fill #0

## 2024-02-29 NOTE — Progress Notes (Signed)
 NAMEJacqlyn Sellers  MRN: [id number]  DOB: 1971/03/09    Referring physician:??Deanna Thersia Jansky, MD  PCP:?Deanna Thersia Jansky, MD    CHIEF COMPLAINT:?Facial aesthetics    HPI:?  This is a 53 y.o. year old female who presents in consultation for facial aging.    Specifically, the patient is most bothered by the appearance of her cheeks, nasolabial folds and skin wrinkles and discoloration.   Use of neurotoxin/fillers? Yes, a month ago and unhappy with her results at outside facility.    Patient denies h/o glaucoma, HTN, coagulopathies, or thyroid conditions. Not on any anticoagulation.       Family History:   Family history is negative for bleeding/clotting disorders, problems with anesthesia, connective tissue disorders.     Social History:   Social History   Socioeconomic History   Marital status: Divorced    Spouse name: Not on file   Number of children: Not on file   Years of education: Not on file   Highest education level: Not on file  Occupational History   Not on file  Tobacco Use   Smoking status: Never   Smokeless tobacco: Never  Vaping Use   Vaping status: Never Used  Substance and Sexual Activity   Alcohol use: Yes    Comment: rarely   Drug use: Never   Sexual activity: Yes    Partners: Male  Other Topics Concern   Not on file  Social History Narrative   Not on file   Social Drivers of Health   Financial Resource Strain: Low Risk  (11/22/2020)   Received from [facility]   Overall Financial Resource Strain (CARDIA)    Difficulty of Paying Living Expenses: Not hard at all  Food Insecurity: Low Risk  (02/28/2024)   Received from [facility]   Hunger Vital Sign    Within the past 12 months, you worried that your food would run out before you got money to buy more: Never true    Within the past 12 months, the food you bought just didn't last and you didn't have money to get more. : Never true  Transportation Needs: No Transportation Needs  (02/28/2024)   Received from [facility]    In the past 12 months, has lack of reliable transportation kept you from medical appointments, meetings, work or from getting things needed for daily living? : No  Physical Activity: Inactive (11/22/2020)   Received from [facility]   Exercise Vital Sign    On average, how many days per week do you engage in moderate to strenuous exercise (like a brisk walk)?: 0 days    On average, how many minutes do you engage in exercise at this level?: 0 min  Stress: No Stress Concern Present (11/22/2020)   Received from [facility] - Occupational Stress Questionnaire    Feeling of Stress : Not at all  Social Connections: Unknown (11/16/2022)   Received from [facility]   Social Network    Social Network: Not on file   PMH:  Past Medical History:  Diagnosis Date   Abnormal Pap smear of cervix    Anemia    Asthma    HLD (hyperlipidemia)    Hypothyroidism    Migraine    Urticaria    Vitamin D deficiency       PSH:  Past Surgical History:  Procedure Laterality Date   CESAREAN SECTION N/A 1993   TUBAL LIGATION  MEDICATIONS:?   Current Outpatient Medications:    albuterol (PROAIR HFA) 108 (90 Base) MCG/ACT inhaler, ProAir HFA 90 mcg/actuation aerosol inhaler, Disp: , Rfl:    budesonide-formoterol (SYMBICORT) 80-4.5 MCG/ACT inhaler, Symbicort 80 mcg-4.5 mcg/actuation HFA aerosol inhaler, Disp: , Rfl:    cetirizine (ZYRTEC) 10 MG tablet, , Disp: , Rfl:    EPINEPHrine 0.3 mg/0.3 mL IJ SOAJ injection, Inject into the muscle., Disp: , Rfl:    ferrous sulfate 325 (65 FE) MG tablet, Take by mouth., Disp: , Rfl:    fluconazole (DIFLUCAN) 200 MG tablet, , Disp: , Rfl:    hydrochlorothiazide (HYDRODIURIL) 25 MG tablet, Take 25 mg by mouth daily as needed., Disp: , Rfl:    ipratropium-albuterol (DUONEB) 0.5-2.5 (3) MG/3ML SOLN, Inhale into the lungs., Disp: , Rfl:    levothyroxine  (SYNTHROID) 137 MCG tablet, Synthroid 137 mcg tablet  Take 1 tablet every day by oral route., Disp: , Rfl:    lidocaine  23% - tetracaine  7% topical ointment, plasticized,, Apply 1 Application topically once for 1 dose. Bring to appointment with me 40 minutes early and nurse will apply., Disp: 60 g, Rfl: 0   predniSONE  (DELTASONE ) 50 MG tablet, Take 1 tablet (50 mg total) by mouth daily., Disp: 5 tablet, Rfl: 0   famotidine (PEPCID) 40 MG tablet, Take by mouth. (Patient not taking: Reported on 02/29/2024), Disp: , Rfl:    meloxicam  (MOBIC ) 7.5 MG tablet, Take 1 tablet (7.5 mg total) by mouth daily. (Patient not taking: Reported on 02/29/2024), Disp: 14 tablet, Rfl: 0   montelukast (SINGULAIR) 10 MG tablet, Take by mouth. (Patient not taking: Reported on 02/29/2024), Disp: , Rfl:     ALLERGIES:?  is allergic to shellfish allergy, cinnamon, latex, and povidone iodine.    REVIEW OF SYSTEMS:  Review of Systems  Successful      VITALS:  Blood pressure (!) 114/90, pulse 90, SpO2 94%.   BP (!) 114/90 (BP Location: Left Arm, Patient Position: Sitting, Cuff Size: Normal)   Pulse 90   SpO2 94%   There is no height or weight on file to calculate BMI.  General: Well appearing, no apparent distress.  HEENT: Normocephalic, atraumatic, PERRL. Overall aged appearance to periorbital region.  -Brow: Brows are at the level of the supraorbital rim. -Upper lid: Mild dermatochalasis of upper lid with excess skin covering lid crease/eyelashes -Lower lid: Mild amount of excess skin. No presence of orbital fat prolapse. Position of lower lid is appropiate relative to the lower limbus. Snap back test is normal. Mild orbitomalar crease.   Midface: Overall midfacial volume is adequate. Prominent nasolabial folds, mild marionette lines, jowls, submental skin laxity and redundancy.   Neck: mild skin laxity at midline that extends down to the upper third of her neck, no prominent digastric muscles or submandibular glands,  crevicomental angle is blunted but not significantly. She does not have significant adiposity of her neck.     ASSESSMENT/PLAN  Assessment & Plan      We discussed temporal browlift. This is usually performed in conjunction with a blepharoplasty. Risks specific to this portion include hair loss, recurrent brow ptosis, asymmetry and VII nerve dysfunction and the usual surgical risks.     We discussed fat grafting. We are never sure how much fat will remain and other risks relate to donor site infection, irregularity and pain. Irregularity at the site of injection and chronic infection is a remote possibility.  We discussed the significant anti aging benefit of volume replacement.  We also discussed ZO skin care and that this forms the basis of most aesthetic plans for facial rejuvenation. I encourage most patients to consider this as it is non-surgical and makes a big difference.    We discussed Moxy Laser.  Patient understands the usual postoperative course.  Risks including scarring, 15-20% chance of needing revisional surgery, asymmetry, irregularities, airway difficulty, skin necrosis, bleeding and the usual postoperative risks associated with anesthesia were discussed.      We did discuss surgery and anesthesia in general and the risks related such as DVT/PE, infection, bleeding, organ injury, and other cardiovascular events. These are unexpected but the risk thereof can never be 0%.     The patient has a good understanding of all the risks and benefits, postoperative course and care. We obtained pictures and will give the patient quotes. All questions were answered.     The plan agreed upon includes: Moxy Laser First, Possible Botox/Fillers or Fat Grafting to the face later.   I spent 30 minutes counseling the patient and discussing plan of care.  Reinhart Saulters M Joshua Zeringue

## 2024-03-11 ENCOUNTER — Other Ambulatory Visit (HOSPITAL_COMMUNITY): Payer: Self-pay

## 2024-04-17 ENCOUNTER — Other Ambulatory Visit: Payer: Self-pay

## 2024-04-17 ENCOUNTER — Other Ambulatory Visit (HOSPITAL_COMMUNITY): Payer: Self-pay

## 2024-04-17 MED ORDER — LIDOCAINE 23% - TETRACAINE 7% TOPICAL OINTMENT (PLASTICIZED)
1.0000 | TOPICAL_OINTMENT | Freq: Once | CUTANEOUS | 0 refills | Status: AC
  Filled 2024-04-17: qty 60, 1d supply, fill #0

## 2024-04-17 NOTE — Progress Notes (Signed)
 Topical Lidocaine .  Deanna Cast M. Toryn Mcclinton, MD [facility]

## 2024-04-21 ENCOUNTER — Telehealth: Payer: Self-pay

## 2024-04-21 NOTE — Telephone Encounter (Signed)
 Pt called and lvmail, her face is breaking out and she wants to know if it will still be ok to do the Laser Friday 10th, this week

## 2024-04-25 ENCOUNTER — Other Ambulatory Visit
